# Patient Record
Sex: Female | Born: 1965 | Race: White | Hispanic: No | Marital: Married | Smoking: Never smoker
Health system: Southern US, Community
[De-identification: ages and names within clinical notes are randomized; demographics above are authoritative.]

## PROBLEM LIST (undated history)

## (undated) DIAGNOSIS — N200 Calculus of kidney: Secondary | ICD-10-CM

## (undated) DIAGNOSIS — E785 Hyperlipidemia, unspecified: Secondary | ICD-10-CM

## (undated) DIAGNOSIS — D649 Anemia, unspecified: Secondary | ICD-10-CM

## (undated) DIAGNOSIS — E669 Obesity, unspecified: Secondary | ICD-10-CM

## (undated) DIAGNOSIS — M1990 Unspecified osteoarthritis, unspecified site: Secondary | ICD-10-CM

## (undated) DIAGNOSIS — I10 Essential (primary) hypertension: Secondary | ICD-10-CM

## (undated) DIAGNOSIS — T4145XA Adverse effect of unspecified anesthetic, initial encounter: Secondary | ICD-10-CM

## (undated) DIAGNOSIS — Z460 Encounter for fitting and adjustment of spectacles and contact lenses: Secondary | ICD-10-CM

## (undated) DIAGNOSIS — K8020 Calculus of gallbladder without cholecystitis without obstruction: Secondary | ICD-10-CM

## (undated) DIAGNOSIS — N920 Excessive and frequent menstruation with regular cycle: Secondary | ICD-10-CM

## (undated) DIAGNOSIS — T8859XA Other complications of anesthesia, initial encounter: Secondary | ICD-10-CM

## (undated) HISTORY — DX: Hyperlipidemia, unspecified: E78.5

## (undated) HISTORY — DX: Unspecified osteoarthritis, unspecified site: M19.90

## (undated) HISTORY — PX: ENDOMETRIAL ABLATION: SHX621

## (undated) HISTORY — DX: Calculus of kidney: N20.0

## (undated) HISTORY — DX: Obesity, unspecified: E66.9

## (undated) HISTORY — DX: Essential (primary) hypertension: I10

## (undated) HISTORY — DX: Calculus of gallbladder without cholecystitis without obstruction: K80.20

## (undated) HISTORY — DX: Anemia, unspecified: D64.9

---

## 1987-07-20 HISTORY — PX: OTHER SURGICAL HISTORY: SHX169

## 2000-05-05 ENCOUNTER — Encounter (INDEPENDENT_AMBULATORY_CARE_PROVIDER_SITE_OTHER): Payer: Self-pay | Admitting: Specialist

## 2000-05-05 ENCOUNTER — Other Ambulatory Visit: Admission: RE | Admit: 2000-05-05 | Discharge: 2000-05-05 | Payer: Self-pay | Admitting: *Deleted

## 2000-07-19 HISTORY — PX: NASAL SINUS SURGERY: SHX719

## 2000-09-16 ENCOUNTER — Other Ambulatory Visit: Admission: RE | Admit: 2000-09-16 | Discharge: 2000-09-16 | Payer: Self-pay | Admitting: Obstetrics and Gynecology

## 2001-07-19 HISTORY — PX: OTHER SURGICAL HISTORY: SHX169

## 2001-11-13 ENCOUNTER — Other Ambulatory Visit: Admission: RE | Admit: 2001-11-13 | Discharge: 2001-11-13 | Payer: Self-pay | Admitting: Obstetrics and Gynecology

## 2002-11-15 ENCOUNTER — Other Ambulatory Visit: Admission: RE | Admit: 2002-11-15 | Discharge: 2002-11-15 | Payer: Self-pay | Admitting: Obstetrics and Gynecology

## 2003-12-12 ENCOUNTER — Other Ambulatory Visit: Admission: RE | Admit: 2003-12-12 | Discharge: 2003-12-12 | Payer: Self-pay | Admitting: Obstetrics and Gynecology

## 2004-12-30 ENCOUNTER — Other Ambulatory Visit: Admission: RE | Admit: 2004-12-30 | Discharge: 2004-12-30 | Payer: Self-pay | Admitting: Obstetrics and Gynecology

## 2005-11-16 ENCOUNTER — Encounter: Admission: RE | Admit: 2005-11-16 | Discharge: 2005-11-16 | Payer: Self-pay | Admitting: Family Medicine

## 2005-11-24 ENCOUNTER — Encounter: Admission: RE | Admit: 2005-11-24 | Discharge: 2005-11-24 | Payer: Self-pay | Admitting: Family Medicine

## 2005-12-03 ENCOUNTER — Encounter: Admission: RE | Admit: 2005-12-03 | Discharge: 2005-12-03 | Payer: Self-pay | Admitting: Obstetrics and Gynecology

## 2006-03-01 ENCOUNTER — Other Ambulatory Visit: Admission: RE | Admit: 2006-03-01 | Discharge: 2006-03-01 | Payer: Self-pay | Admitting: Obstetrics and Gynecology

## 2006-06-23 ENCOUNTER — Encounter: Admission: RE | Admit: 2006-06-23 | Discharge: 2006-06-23 | Payer: Self-pay | Admitting: Orthopedic Surgery

## 2008-03-18 ENCOUNTER — Emergency Department (HOSPITAL_COMMUNITY): Admission: EM | Admit: 2008-03-18 | Discharge: 2008-03-18 | Payer: Self-pay | Admitting: *Deleted

## 2009-07-19 HISTORY — PX: ABLATION ON ENDOMETRIOSIS: SHX5787

## 2009-07-31 ENCOUNTER — Ambulatory Visit (HOSPITAL_COMMUNITY): Admission: RE | Admit: 2009-07-31 | Discharge: 2009-07-31 | Payer: Self-pay | Admitting: Obstetrics and Gynecology

## 2010-10-04 LAB — CBC
HCT: 39.3 % (ref 36.0–46.0)
MCV: 90 fL (ref 78.0–100.0)
Platelets: 328 10*3/uL (ref 150–400)
RDW: 14.9 % (ref 11.5–15.5)

## 2010-10-04 LAB — GLUCOSE, CAPILLARY: Glucose-Capillary: 149 mg/dL — ABNORMAL HIGH (ref 70–99)

## 2010-10-04 LAB — BASIC METABOLIC PANEL
GFR calc Af Amer: >60 mL/min (ref >60)
Potassium: 3.5 mEq/L (ref 3.5–5.1)
Sodium: 138 mEq/L (ref 135–145)

## 2011-05-11 ENCOUNTER — Other Ambulatory Visit: Payer: Self-pay | Admitting: Family Medicine

## 2011-05-11 DIAGNOSIS — R1011 Right upper quadrant pain: Secondary | ICD-10-CM

## 2011-05-12 ENCOUNTER — Ambulatory Visit
Admission: RE | Admit: 2011-05-12 | Discharge: 2011-05-12 | Disposition: A | Discharge status: Home / Self Care | Payer: PRIVATE HEALTH INSURANCE | Source: Ambulatory Visit | Attending: Family Medicine | Admitting: Family Medicine

## 2011-05-12 ENCOUNTER — Emergency Department (HOSPITAL_COMMUNITY)
Admission: EM | Admit: 2011-05-12 | Discharge: 2011-05-12 | Disposition: A | Discharge status: Home / Self Care | Payer: PRIVATE HEALTH INSURANCE | Attending: Emergency Medicine | Admitting: Emergency Medicine

## 2011-05-12 ENCOUNTER — Telehealth (INDEPENDENT_AMBULATORY_CARE_PROVIDER_SITE_OTHER): Payer: Self-pay | Admitting: General Surgery

## 2011-05-12 DIAGNOSIS — R1011 Right upper quadrant pain: Secondary | ICD-10-CM

## 2011-05-12 NOTE — Telephone Encounter (Signed)
Received a call from Tal[place] at [facility] [phone] stating Christina Howard was at their office and they wanted to set her up with an appointment at our office. I looked up the information regarding the pt and found that she had an ultrasound of her abdomen, and the results show acute "Cholelithiasis without sonographic findings for acute cholecystitis. Equivocal sonographic Murphy's sign." Upon asking in triage, French Ana advised the patient needs to go directly to the ER for evaluation, since if she was seen here, should would end up being sent there. This information was relayed to [place] at [place].

## 2011-05-14 ENCOUNTER — Ambulatory Visit (INDEPENDENT_AMBULATORY_CARE_PROVIDER_SITE_OTHER): Payer: PRIVATE HEALTH INSURANCE | Admitting: General Surgery

## 2011-05-14 ENCOUNTER — Encounter (INDEPENDENT_AMBULATORY_CARE_PROVIDER_SITE_OTHER): Payer: Self-pay | Admitting: General Surgery

## 2011-05-14 VITALS — BP 134/92 | HR 76 | Temp 97.8°F | Ht 64.0 in | Wt 193.0 lb

## 2011-05-14 DIAGNOSIS — K8020 Calculus of gallbladder without cholecystitis without obstruction: Secondary | ICD-10-CM

## 2011-05-14 NOTE — Patient Instructions (Addendum)
Our office schedulers will call you early next week to schedule your surgery. If you do not hear from my office by Tuesday afternoon, please call us [phone]   Laparoscopic Cholecystectomy Laparoscopic cholecystectomy is surgery to remove the gallbladder. The gallbladder is located slightly to the right of center in the abdomen, behind the liver. It is a concentrating and storage sac for the bile produced in the liver. Bile aids in the digestion and absorption of fats. Gallbladder disease (cholecystitis) is an inflammation of your gallbladder. This condition is usually caused by a buildup of gallstones (cholelithiasis) in your gallbladder. Gallstones can block the flow of bile, resulting in inflammation and pain. In severe cases, emergency surgery may be required. When emergency surgery is not required, you will have time to prepare for the procedure. Laparoscopic surgery is an alternative to open surgery. Laparoscopic surgery usually has a shorter recovery time. Your common bile duct may also need to be examined and explored. Your caregiver will discuss this with you if he or she feels this should be done. If stones are found in the common bile duct, they may be removed. LET YOUR CAREGIVER KNOW ABOUT:  Allergies to food or medicine.   Medicines taken, including vitamins, herbs, eyedrops, over-the-counter medicines, and creams.   Use of steroids (by mouth or creams).   Previous problems with anesthetics or numbing medicines.   History of bleeding problems or blood clots.   Previous surgery.   Other health problems, including diabetes and kidney problems.   Possibility of pregnancy, if this applies.  RISKS AND COMPLICATIONS All surgery is associated with risks. Some problems that may occur following this procedure include:  Infection.   Damage to the common bile duct, nerves, arteries, veins, or other internal organs such as the stomach or intestines.   Bleeding.   A stone may remain  in the common bile duct.  BEFORE THE PROCEDURE  Do not take aspirin for 3 days prior to surgery or blood thinners for 1 week prior to surgery.   Do not eat or drink anything after midnight the night before surgery.   Let your caregiver know if you develop a cold or other infectious problem prior to surgery.   You should be present 60 minutes before the procedure or as directed.  PROCEDURE  You will be given medicine that makes you sleep (general anesthetic). When you are asleep, your surgeon will make several small cuts (incisions) in your abdomen. One of these incisions is used to insert a small, lighted scope (laparoscope) into the abdomen. The laparoscope helps the surgeon see into your abdomen. Carbon dioxide gas will be pumped into your abdomen. The gas allows more room for the surgeon to perform your surgery. Other operating instruments are inserted through the other incisions. Laparoscopic procedures may not be appropriate when:  There is major scarring from previous surgery.   The gallbladder is extremely inflamed.   There are bleeding disorders or unexpected cirrhosis of the liver.   A pregnancy is near term.   Other conditions make the laparoscopic procedure impossible.  If your surgeon feels it is not safe to continue with a laparoscopic procedure, he or she will perform an open abdominal procedure. In this case, the surgeon will make an incision to open the abdomen. This gives the surgeon a larger view and field to work within. This may allow the surgeon to perform procedures that sometimes cannot be performed with a laparoscope alone. Open surgery has a longer recovery time.  AFTER THE PROCEDURE  You will be taken to the recovery area where a nurse will watch and check your progress.   You may be allowed to go home the same day.   Do not resume physical activities until directed by your caregiver.   You may resume a normal diet and activities as directed.  Document  Released: 07/05/2005 Document Revised: 03/17/2011 Document Reviewed: 12/18/2010 [facility] Patient Information 2012 [place], [place].

## 2011-05-17 ENCOUNTER — Telehealth (INDEPENDENT_AMBULATORY_CARE_PROVIDER_SITE_OTHER): Payer: Self-pay | Admitting: General Surgery

## 2011-05-17 NOTE — Telephone Encounter (Signed)
Requesting last office notes.

## 2011-05-22 ENCOUNTER — Encounter (INDEPENDENT_AMBULATORY_CARE_PROVIDER_SITE_OTHER): Payer: Self-pay | Admitting: General Surgery

## 2011-05-22 NOTE — Progress Notes (Signed)
Chief Complaint  Patient presents with  . Pre-op Exam    eval gallbladder    HPI Christina Howard is a 45 y.o. female.   HPI 45-year-old obese Caucasian female referred by her primary care physician for evaluation for gallbladder disease. The patient states that she was in her usual state of health until earlier this week. She developed severe pain in her upper abdomen Monday afternoon. She describes it as a bandlike sensation. It was very intense. She saw her primary care physician on Tuesday who subsequently ordered an ultrasound which demonstrated gallstones. She was evaluated in triage at the emergency department. It was felt that she did not have acute cholecystitis and was scheduled to followup with our office as an outpatient.  She states that she's had similar pain in the past but it has not been nearly as intense. The pain generally occurs after eating a meal. She describes it as a dull crushing pain that lasts for about 3-4 hours. The only alleviating factor has been some oral pain medication. She denies any jaundice. She states that she normally has alternating bouts of diarrhea and constipation. She denies any weight loss. She denies any fevers or chills. She denies any acholic stools. She denies any excessive NSAID use. She denies any dysphagia. She denies any heartburn. Past Medical History  Diagnosis Date  . Diabetes mellitus   . Hyperlipidemia   . Hypertension   . Obesity (BMI 30-39.9)     Past Surgical History  Procedure Date  . Knee scopes 2003    total  . Nasal sinus surgery 2002  . Barthalmus glands removed 1989    Family History  Problem Relation Age of Onset  . Hypertension Mother   . Hyperlipidemia Mother   . Diabetes Father   . Hyperlipidemia Father   . Hypertension Father     Social History History  Substance Use Topics  . Smoking status: Never Smoker   . Smokeless tobacco: Not on file  . Alcohol Use: Yes     very rarely    Allergies  Allergen  Reactions  . Penicillins     Yeast in mouth  . Sulfa Drugs Cross Reactors     Headache     Current Outpatient Prescriptions  Medication Sig Dispense Refill  . atorvastatin (LIPITOR) 10 MG tablet Take 10 mg by mouth daily.        . diphenhydrAMINE (BENADRYL) 25 mg capsule Take 25 mg by mouth as needed.        . hydrochlorothiazide (HYDRODIURIL) 25 MG tablet Take 25 mg by mouth daily.        . HYDROcodone-acetaminophen (VICODIN) 5-500 MG per tablet Take 1 tablet by mouth as needed.        . metFORMIN (GLUCOPHAGE) 500 MG tablet Take 500 mg by mouth 2 (two) times daily with a meal.        . metoprolol (TOPROL-XL) 50 MG 24 hr tablet Take 50 mg by mouth daily.        . norethindrone (AYGESTIN) 5 MG tablet Take 5 mg by mouth daily. 1-15 days of each month         Review of Systems Review of Systems  Constitutional: Negative for fever, chills and unexpected weight change.  HENT: Negative for hearing loss, congestion, sore throat, trouble swallowing and voice change.   Eyes: Negative for visual disturbance.  Respiratory: Negative for cough, shortness of breath and wheezing.   Cardiovascular: Negative for chest pain, palpitations and leg   swelling.       No DOE, no orthopnea, no SOB, no PND  Gastrointestinal: Positive for nausea, abdominal pain, diarrhea and constipation. Negative for vomiting, blood in stool, abdominal distention and anal bleeding.  Genitourinary: Negative for hematuria, vaginal bleeding and difficulty urinating.  Musculoskeletal: Negative for arthralgias.  Skin: Negative for rash and wound.  Neurological: Negative for seizures, syncope and headaches.  Hematological: Negative for adenopathy. Does not bruise/bleed easily.  Psychiatric/Behavioral: Negative for confusion.    Blood pressure 134/92, pulse 76, temperature 97.8 F (36.6 C), height 5' 4" (1.626 Howard), weight 193 lb (87.544 kg).  Physical Exam Physical Exam  Vitals reviewed. Constitutional: She is oriented to  person, place, and time. She appears well-developed and well-nourished. No distress.  HENT:  Head: Normocephalic and atraumatic.  Eyes: Conjunctivae are normal. No scleral icterus.  Neck: Normal range of motion. Neck supple. No tracheal deviation present. No thyromegaly present.  Cardiovascular: Normal rate, regular rhythm and normal heart sounds.   Pulmonary/Chest: Effort normal and breath sounds normal. No respiratory distress.  Abdominal: Soft. Bowel sounds are normal. She exhibits no distension. There is no tenderness. There is no guarding.  Musculoskeletal: Normal range of motion. She exhibits no edema.  Neurological: She is alert and oriented to person, place, and time.  Skin: Skin is warm and dry. No rash noted.       No jaundice  Psychiatric: She has a normal mood and affect. Her behavior is normal. Judgment and thought content normal.    Data Reviewed COMPLETE ABDOMINAL ULTRASOUND 04/2011 Comparison: CT scan 03/18/2008.  Findings:  Gallbladder: Numerous echogenic gallstones are present with  associated acoustic shadowing. The largest measures approximately  16 mm. No gallbladder wall thickening or pericholecystic fluid.  Equivocal sonographic Murphy's sign.   Common bile duct: Normal in caliber measuring a maximum of 3.9mm.   Liver: Diffuse increased echogenicity suggesting fatty  infiltration. Probable areas of focal fatty sparing involving the  caudate lobe, gallbladder fossa and right hepatic lobe. Looking  back at the prior CT examinations these findings are stable. The  smaller nodular lesion in the right lobe is not identified for  certain on the prior CT scan and I would recommend a follow-up  abdominal ultrasound examination in 6 months to confer stability.  No biliary dilatation.   IVC: Normal caliber.  Pancreas: Sonographically normal.  Spleen: Normal size and echogenicity without focal lesions.  Right Kidney: 12.0 cm in length. Normal renal cortical thickness   and echogenicity without focal lesions or hydronephrosis.  Left Kidney: 13.4 cm in length. Normal renal cortical thickness  and echogenicity without focal lesions or hydronephrosis.  Abdominal aorta: Normal caliber.   IMPRESSION:  1. Cholelithiasis without sonographic findings for acute  cholecystitis. Equivocal sonographic Murphy's sign.  2. Hepatic abnormalities as discussed above are likely due to  focal fatty sparing in a background of fatty infiltration.  Recommend follow-up ultrasound examination in 6 months.  3. Normal sonographic appearance of the pancreas, spleen and both  kidneys.   Assessment    Symptomatic cholelithiasis Diabetes Mellitus Dyslipidemia    Plan    We discussed gallbladder disease. The patient was given educational material. We discussed non-operative and operative management.   I discussed laparoscopic cholecystectomy with cholangiogram in detail.  The patient was given educational material as well as diagrams detailing the procedure.  We discussed the risks and benefits of a laparoscopic cholecystectomy including, but not limited to bleeding, infection, injury to surrounding structures such as the intestine   or liver, bile leak, retained gallstones, need to convert to an open procedure, prolonged diarrhea, blood clots such as  DVT, common bile duct injury, anesthesia risks, and possible need for additional procedures.  We discussed the typical post-operative recovery course. I explained that the likelihood of improvement of their symptoms is good.  The patient has elected to proceed with laparoscopic cholecystectomy with cholangiogram.  Christina Hanaway Howard. Habiba Treloar, MD, FACS        Christina Howard 05/22/2011, 2:20 PM    

## 2011-05-24 ENCOUNTER — Encounter (HOSPITAL_COMMUNITY): Payer: Self-pay | Admitting: Pharmacy Technician

## 2011-05-25 ENCOUNTER — Ambulatory Visit (HOSPITAL_COMMUNITY)
Admission: RE | Admit: 2011-05-25 | Discharge: 2011-05-25 | Disposition: A | Discharge status: Home / Self Care | Payer: PRIVATE HEALTH INSURANCE | Source: Ambulatory Visit | Attending: General Surgery | Admitting: General Surgery

## 2011-05-25 ENCOUNTER — Encounter (HOSPITAL_COMMUNITY): Payer: PRIVATE HEALTH INSURANCE

## 2011-05-25 ENCOUNTER — Encounter (HOSPITAL_COMMUNITY): Payer: Self-pay

## 2011-05-25 ENCOUNTER — Other Ambulatory Visit: Payer: Self-pay

## 2011-05-25 DIAGNOSIS — Z01812 Encounter for preprocedural laboratory examination: Secondary | ICD-10-CM | POA: Insufficient documentation

## 2011-05-25 DIAGNOSIS — Z01818 Encounter for other preprocedural examination: Secondary | ICD-10-CM | POA: Insufficient documentation

## 2011-05-25 LAB — DIFFERENTIAL
Basophils Absolute: 0 10*3/uL (ref 0.0–0.1)
Eosinophils Absolute: 0.3 10*3/uL (ref 0.0–0.7)
Eosinophils Relative: 4 % (ref 0–5)
Lymphs Abs: 1.7 10*3/uL (ref 0.7–4.0)
Monocytes Absolute: 0.7 10*3/uL (ref 0.1–1.0)

## 2011-05-25 LAB — SURGICAL PCR SCREEN
MRSA, PCR: NEGATIVE
Staphylococcus aureus: NEGATIVE

## 2011-05-25 LAB — COMPREHENSIVE METABOLIC PANEL
AST: 14 U/L (ref 0–37)
BUN: 14 mg/dL (ref 6–23)
CO2: 22 mEq/L (ref 19–32)
Calcium: 9.9 mg/dL (ref 8.4–10.5)
Creatinine, Ser: 0.59 mg/dL (ref 0.50–1.10)
GFR calc Af Amer: >90 mL/min (ref >90)
Glucose, Bld: 129 mg/dL — ABNORMAL HIGH (ref 70–99)

## 2011-05-25 LAB — CBC
HCT: 34.1 % — ABNORMAL LOW (ref 36.0–46.0)
MCH: 27.8 pg (ref 26.0–34.0)
MCHC: 32.6 g/dL (ref 30.0–36.0)
MCV: 85.3 fL (ref 78.0–100.0)
Platelets: 345 10*3/uL (ref 150–400)
WBC: 7.8 10*3/uL (ref 4.0–10.5)

## 2011-05-25 NOTE — Patient Instructions (Addendum)
20 Christina Howard  05/25/2011   Your procedure is scheduled on:  11/12/2  Report to [facility] at 8:15 AM.  Call this number if you have problems the morning of surgery: [phone]   Remember:   Do not eat food:After Midnight.  Do not drink clear liquids: After Midnight.  Take these medicines the morning of surgery with A SIP OF WATER: VICODIN IF NEEDED   Do not wear jewelry, make-up or nail polish.  Do not wear lotions, powders, or perfumes. You may wear deodorant.  Do not shave 48 hours prior to surgery.  Do not bring valuables to the hospital.  Contacts, dentures or bridgework may not be worn into surgery.  Leave suitcase in the car. After surgery it may be brought to your room.  For patients admitted to the hospital, checkout time is 11:00 AM the day of discharge.   Patients discharged the day of surgery will not be allowed to drive home.  Name and phone number of your driver:   Special Instructions: Incentive Spirometry - Practice and bring it with you on the day of surgery. and CHG Shower Use Special Wash: 1/2 bottle night before surgery and 1/2 bottle morning of surgery.   Please read over the following fact sheets that you were given: MRSA Information

## 2011-05-26 ENCOUNTER — Telehealth (INDEPENDENT_AMBULATORY_CARE_PROVIDER_SITE_OTHER): Payer: Self-pay | Admitting: General Surgery

## 2011-05-26 NOTE — Telephone Encounter (Signed)
Message copied by Liliana Cline on Wed May 26, 2011  8:46 AM ------      Message from: Andrey Campanile, ERIC M      Created: Tue May 25, 2011  5:07 PM       Labs ok for surgery

## 2011-05-26 NOTE — Telephone Encounter (Signed)
Labs okay for surgery faxed to pre-op.  

## 2011-05-31 ENCOUNTER — Ambulatory Visit (HOSPITAL_COMMUNITY)
Admission: RE | Admit: 2011-05-31 | Discharge: 2011-05-31 | Disposition: A | Discharge status: Home / Self Care | Payer: PRIVATE HEALTH INSURANCE | Source: Ambulatory Visit | Attending: General Surgery | Admitting: General Surgery

## 2011-05-31 ENCOUNTER — Other Ambulatory Visit (INDEPENDENT_AMBULATORY_CARE_PROVIDER_SITE_OTHER): Payer: Self-pay | Admitting: General Surgery

## 2011-05-31 ENCOUNTER — Encounter (HOSPITAL_COMMUNITY): Payer: Self-pay | Admitting: Anesthesiology

## 2011-05-31 ENCOUNTER — Encounter (HOSPITAL_COMMUNITY): Payer: Self-pay | Admitting: *Deleted

## 2011-05-31 ENCOUNTER — Ambulatory Visit (HOSPITAL_COMMUNITY): Payer: PRIVATE HEALTH INSURANCE

## 2011-05-31 ENCOUNTER — Encounter (HOSPITAL_COMMUNITY)
Admission: RE | Disposition: A | Discharge status: Home / Self Care | Payer: Self-pay | Source: Ambulatory Visit | Attending: General Surgery

## 2011-05-31 DIAGNOSIS — Z79899 Other long term (current) drug therapy: Secondary | ICD-10-CM | POA: Insufficient documentation

## 2011-05-31 DIAGNOSIS — E669 Obesity, unspecified: Secondary | ICD-10-CM | POA: Insufficient documentation

## 2011-05-31 DIAGNOSIS — Z01810 Encounter for preprocedural cardiovascular examination: Secondary | ICD-10-CM | POA: Insufficient documentation

## 2011-05-31 DIAGNOSIS — R1013 Epigastric pain: Secondary | ICD-10-CM | POA: Insufficient documentation

## 2011-05-31 DIAGNOSIS — K811 Chronic cholecystitis: Secondary | ICD-10-CM

## 2011-05-31 DIAGNOSIS — E785 Hyperlipidemia, unspecified: Secondary | ICD-10-CM | POA: Insufficient documentation

## 2011-05-31 DIAGNOSIS — I10 Essential (primary) hypertension: Secondary | ICD-10-CM | POA: Insufficient documentation

## 2011-05-31 DIAGNOSIS — R1011 Right upper quadrant pain: Secondary | ICD-10-CM | POA: Insufficient documentation

## 2011-05-31 DIAGNOSIS — E119 Type 2 diabetes mellitus without complications: Secondary | ICD-10-CM | POA: Insufficient documentation

## 2011-05-31 DIAGNOSIS — K8010 Calculus of gallbladder with chronic cholecystitis without obstruction: Secondary | ICD-10-CM | POA: Insufficient documentation

## 2011-05-31 HISTORY — PX: CHOLECYSTECTOMY: SHX55

## 2011-05-31 LAB — GLUCOSE, CAPILLARY: Glucose-Capillary: 183 mg/dL — ABNORMAL HIGH (ref 70–99)

## 2011-05-31 SURGERY — LAPAROSCOPIC CHOLECYSTECTOMY WITH INTRAOPERATIVE CHOLANGIOGRAM
Anesthesia: General | Wound class: Clean Contaminated

## 2011-05-31 MED ORDER — CIPROFLOXACIN IN D5W 400 MG/200ML IV SOLN
INTRAVENOUS | Status: AC
  Filled 2011-05-31: qty 200

## 2011-05-31 MED ORDER — CIPROFLOXACIN IN D5W 400 MG/200ML IV SOLN
400.0000 mg | Freq: Once | INTRAVENOUS | Status: AC
  Administered 2011-05-31: 400 mg via INTRAVENOUS
  Filled 2011-05-31: qty 200

## 2011-05-31 MED ORDER — SODIUM CHLORIDE 0.9 % IR SOLN
Status: DC | PRN
  Administered 2011-05-31: 1000 mL

## 2011-05-31 MED ORDER — OXYCODONE-ACETAMINOPHEN 5-325 MG PO TABS
ORAL_TABLET | ORAL | Status: AC
  Filled 2011-05-31: qty 1

## 2011-05-31 MED ORDER — PROPOFOL 10 MG/ML IV EMUL
INTRAVENOUS | Status: DC | PRN
  Administered 2011-05-31: 200 mg via INTRAVENOUS

## 2011-05-31 MED ORDER — LACTATED RINGERS IV SOLN
INTRAVENOUS | Status: DC
  Administered 2011-05-31: 11:00:00 via INTRAVENOUS
  Administered 2011-05-31: 1000 mL via INTRAVENOUS

## 2011-05-31 MED ORDER — NEOSTIGMINE METHYLSULFATE 1 MG/ML IJ SOLN
INTRAMUSCULAR | Status: DC | PRN
  Administered 2011-05-31: 5 mg via INTRAVENOUS

## 2011-05-31 MED ORDER — IOHEXOL 300 MG/ML  SOLN
INTRAMUSCULAR | Status: DC | PRN
  Administered 2011-05-31: 15 mL

## 2011-05-31 MED ORDER — LACTATED RINGERS IR SOLN
Status: DC | PRN
  Administered 2011-05-31: 1000 mL

## 2011-05-31 MED ORDER — ONDANSETRON HCL 4 MG/2ML IJ SOLN
INTRAMUSCULAR | Status: DC | PRN
  Administered 2011-05-31: 4 mg via INTRAVENOUS

## 2011-05-31 MED ORDER — BUPIVACAINE-EPINEPHRINE 0.25% -1:200000 IJ SOLN
INTRAMUSCULAR | Status: DC | PRN
  Administered 2011-05-31: 24 mL

## 2011-05-31 MED ORDER — ACETAMINOPHEN 10 MG/ML IV SOLN
INTRAVENOUS | Status: DC | PRN
  Administered 2011-05-31: 1000 mg via INTRAVENOUS

## 2011-05-31 MED ORDER — MIDAZOLAM HCL 5 MG/5ML IJ SOLN
INTRAMUSCULAR | Status: DC | PRN
  Administered 2011-05-31: 2 mg via INTRAVENOUS

## 2011-05-31 MED ORDER — LIDOCAINE HCL (CARDIAC) 20 MG/ML IV SOLN
INTRAVENOUS | Status: DC | PRN
  Administered 2011-05-31: 80 mg via INTRAVENOUS

## 2011-05-31 MED ORDER — GLYCOPYRROLATE 0.2 MG/ML IJ SOLN
INTRAMUSCULAR | Status: DC | PRN
  Administered 2011-05-31: .8 mg via INTRAVENOUS

## 2011-05-31 MED ORDER — IOHEXOL 300 MG/ML  SOLN
INTRAMUSCULAR | Status: AC
  Filled 2011-05-31: qty 1

## 2011-05-31 MED ORDER — OXYCODONE-ACETAMINOPHEN 5-325 MG PO TABS: 1.0000 | ORAL_TABLET | ORAL | Status: AC | PRN

## 2011-05-31 MED ORDER — HEMOSTATIC AGENTS (NO CHARGE) OPTIME
TOPICAL | Status: DC | PRN
  Administered 2011-05-31: 1

## 2011-05-31 MED ORDER — OXYCODONE-ACETAMINOPHEN 5-325 MG PO TABS
1.0000 | ORAL_TABLET | ORAL | Status: DC | PRN
  Administered 2011-05-31: 1 via ORAL

## 2011-05-31 MED ORDER — BUPIVACAINE-EPINEPHRINE 0.25% -1:200000 IJ SOLN
INTRAMUSCULAR | Status: AC
  Filled 2011-05-31: qty 1

## 2011-05-31 MED ORDER — FENTANYL CITRATE 0.05 MG/ML IJ SOLN: 25.0000 ug | INTRAMUSCULAR | Status: DC | PRN

## 2011-05-31 MED ORDER — PROMETHAZINE HCL 25 MG/ML IJ SOLN: 6.2500 mg | INTRAMUSCULAR | Status: DC | PRN

## 2011-05-31 MED ORDER — METOCLOPRAMIDE HCL 5 MG/ML IJ SOLN
INTRAMUSCULAR | Status: DC | PRN
  Administered 2011-05-31: 10 mg via INTRAVENOUS

## 2011-05-31 MED ORDER — ROCURONIUM BROMIDE 100 MG/10ML IV SOLN
INTRAVENOUS | Status: DC | PRN
Start: 2011-05-31 — End: 2011-05-31
  Administered 2011-05-31: 35 mg via INTRAVENOUS
  Administered 2011-05-31: 5 mg via INTRAVENOUS

## 2011-05-31 MED ORDER — ACETAMINOPHEN 10 MG/ML IV SOLN
INTRAVENOUS | Status: AC
  Filled 2011-05-31: qty 100

## 2011-05-31 MED ORDER — FENTANYL CITRATE 0.05 MG/ML IJ SOLN
INTRAMUSCULAR | Status: DC | PRN
  Administered 2011-05-31 (×3): 50 ug via INTRAVENOUS

## 2011-05-31 SURGICAL SUPPLY — 42 items
APL SKNCLS STERI-STRIP NONHPOA (GAUZE/BANDAGES/DRESSINGS) ×1
APPLIER CLIP 5 13 M/L LIGAMAX5 (MISCELLANEOUS) ×2
APPLIER CLIP ROT 10 11.4 M/L (STAPLE)
APR CLP MED LRG 11.4X10 (STAPLE)
APR CLP MED LRG 5 ANG JAW (MISCELLANEOUS) ×1
BAG SPEC RTRVL LRG 6X4 10 (ENDOMECHANICALS) ×1
BANDAGE ADHESIVE 1X3 (GAUZE/BANDAGES/DRESSINGS) ×6 IMPLANT
BENZOIN TINCTURE PRP APPL 2/3 (GAUZE/BANDAGES/DRESSINGS) ×2 IMPLANT
CANISTER SUCTION 2500CC (MISCELLANEOUS) ×2 IMPLANT
CHLORAPREP W/TINT 26ML (MISCELLANEOUS) ×2 IMPLANT
CLIP APPLIE 5 13 M/L LIGAMAX5 (MISCELLANEOUS) IMPLANT
CLIP APPLIE ROT 10 11.4 M/L (STAPLE) IMPLANT
CLOTH BEACON ORANGE TIMEOUT ST (SAFETY) ×2 IMPLANT
COVER MAYO STAND STRL (DRAPES) IMPLANT
COVER SURGICAL LIGHT HANDLE (MISCELLANEOUS) ×2 IMPLANT
DECANTER SPIKE VIAL GLASS SM (MISCELLANEOUS) ×2 IMPLANT
DRAPE C-ARM 42X72 X-RAY (DRAPES) IMPLANT
DRAPE LAPAROSCOPIC ABDOMINAL (DRAPES) ×2 IMPLANT
DRAPE UTILITY XL STRL (DRAPES) ×2 IMPLANT
DRSG TEGADERM 2-3/8X2-3/4 SM (GAUZE/BANDAGES/DRESSINGS) ×2 IMPLANT
ELECT REM PT RETURN 9FT ADLT (ELECTROSURGICAL) ×2
ELECTRODE REM PT RTRN 9FT ADLT (ELECTROSURGICAL) ×1 IMPLANT
GLOVE BIO SURGEON STRL SZ7.5 (GLOVE) ×2 IMPLANT
GLOVE BIOGEL PI IND STRL 7.0 (GLOVE) ×1 IMPLANT
GLOVE BIOGEL PI INDICATOR 7.0 (GLOVE) ×1
GLOVE INDICATOR 8.0 STRL GRN (GLOVE) ×2 IMPLANT
GOWN STRL NON-REIN LRG LVL3 (GOWN DISPOSABLE) ×2 IMPLANT
GOWN STRL REIN XL XLG (GOWN DISPOSABLE) ×4 IMPLANT
HEMOSTAT SNOW SURGICEL 2X4 (HEMOSTASIS) ×1 IMPLANT
KIT BASIN OR (CUSTOM PROCEDURE TRAY) ×2 IMPLANT
NS IRRIG 1000ML POUR BTL (IV SOLUTION) ×2 IMPLANT
POUCH SPECIMEN RETRIEVAL 10MM (ENDOMECHANICALS) ×2 IMPLANT
SET CHOLANGIOGRAPH MIX (MISCELLANEOUS) IMPLANT
SET IRRIG TUBING LAPAROSCOPIC (IRRIGATION / IRRIGATOR) ×2 IMPLANT
SOLUTION ANTI FOG 6CC (MISCELLANEOUS) ×2 IMPLANT
STRIP CLOSURE SKIN 1/2X4 (GAUZE/BANDAGES/DRESSINGS) ×2 IMPLANT
SUT MNCRL AB 4-0 PS2 18 (SUTURE) ×2 IMPLANT
TOWEL OR 17X26 10 PK STRL BLUE (TOWEL DISPOSABLE) ×2 IMPLANT
TRAY LAP CHOLE (CUSTOM PROCEDURE TRAY) ×2 IMPLANT
TROCAR BLADELESS OPT 5 75 (ENDOMECHANICALS) ×6 IMPLANT
TROCAR XCEL BLUNT TIP 100MML (ENDOMECHANICALS) ×2 IMPLANT
TUBING INSUFFLATION 10FT LAP (TUBING) ×2 IMPLANT

## 2011-05-31 NOTE — Preoperative (Signed)
Beta Blockers   Reason not to administer Beta Blockers:Pt took Metoprolol 05-30-11 at 2100

## 2011-05-31 NOTE — Interval H&P Note (Signed)
History and Physical Interval Note:   05/31/2011   9:39 AM   Christina Howard  has presented today for surgery, with the diagnosis of symptomatic cholelithiasis  The various methods of treatment have been discussed with the patient and family. After consideration of risks, benefits and other options for treatment, the patient has consented to  Procedure(s): LAPAROSCOPIC CHOLECYSTECTOMY WITH INTRAOPERATIVE CHOLANGIOGRAM as a surgical intervention .  The patients' history has been reviewed, patient examined, no change in status, stable for surgery.  I have reviewed the patients' chart and labs.  Questions were answered to the patient's satisfaction.    Mary Sella. Andrey Campanile, MD, FACS General, Bariatric, & Minimally Invasive Surgery [facility], [place]  [name]  MD

## 2011-05-31 NOTE — Transfer of Care (Signed)
Immediate Anesthesia Transfer of Care Note  Patient: Christina Howard  Procedure(s) Performed:  LAPAROSCOPIC CHOLECYSTECTOMY WITH INTRAOPERATIVE CHOLANGIOGRAM  Patient Location: PACU  Anesthesia Type: General  Level of Consciousness: awake, sedated, patient cooperative and responds to stimulaton  Airway & Oxygen Therapy: Patient Spontanous Breathing and Patient connected to face mask oxgen  Post-op Assessment: Report given to PACU RN  Post vital signs: Reviewed and stable  Complications: No apparent anesthesia complications

## 2011-05-31 NOTE — Op Note (Signed)
Laparoscopic Cholecystectomy with IOC Procedure Note  Indications: This patient presents with symptomatic gallbladder disease and will undergo laparoscopic cholecystectomy.  Pre-operative Diagnosis: Symptomatic cholelithiasis  Post-operative Diagnosis: Chronic cholecystitis  Surgeon: [name]   Assistants: Dr Consuello Bossier  Anesthesia: General endotracheal anesthesia plus 0.25% marcaine with epi  Indications for procedure: 45 yo WF with several episodes of RUQ & epigastric pain lasting several hours. U/s showed gallstones.   Procedure Details  The patient was seen again in the Holding Room. The risks, benefits, complications, treatment options, and expected outcomes were discussed with the patient. The possibilities of reaction to medication, pulmonary aspiration, perforation of viscus, bleeding, recurrent infection, finding a normal gallbladder, the need for additional procedures, failure to diagnose a condition, the possible need to convert to an open procedure, and creating a complication requiring transfusion or operation were discussed with the patient. The likelihood of improving the patient's symptoms with return to their baseline status is good.  The patient and/or family concurred with the proposed plan, giving informed consent. The site of surgery properly noted. The patient was taken to Operating Room, identified as Christina Howard and the procedure verified as Laparoscopic Cholecystectomy with Intraoperative Cholangiogram. A Time Out was held and the above information confirmed.  Prior to the induction of general anesthesia, antibiotic prophylaxis was administered. General endotracheal anesthesia was then administered and tolerated well. After the induction, the abdomen was prepped with Chloraprep and draped in the sterile fashion. The patient was positioned in the supine position. SCDs were in place.   Local anesthetic agent was injected into the skin near the umbilicus and  an incision made. We dissected down to the abdominal fascia with blunt dissection.  The fascia was incised vertically and we entered the peritoneal cavity bluntly.  A pursestring suture of 0-Vicryl was placed around the fascial opening.  The Hasson cannula was inserted and secured with the stay suture.  Pneumoperitoneum was then created with CO2 and tolerated well without any adverse changes in the patient's vital signs. An 5mm port was placed in the subxiphoid position.  Two 5-mm ports were placed in the right upper quadrant. All skin incisions were infiltrated with a local anesthetic agent before making the incision and placing the trocars.   We positioned the patient in reverse Trendelenburg, tilted slightly to the patient's left.  The gallbladder was identified, the fundus grasped and retracted cephalad. Adhesions were lysed bluntly and with the electrocautery where indicated, taking care not to injure any adjacent organs or viscus. The infundibulum was grasped and retracted laterally, exposing the peritoneum overlying the triangle of Calot. This was then divided and exposed in a blunt fashion. A critical view of the cystic duct and cystic artery was obtained.  The cystic duct was clearly identified and bluntly dissected circumferentially. The cystic duct was ligated with a clip distally.   An incision was made in the cystic duct and the [facility] cholangiogram catheter introduced. The catheter was secured using a clip. A cholangiogram was then obtained which showed good visualization of the distal and proximal biliary tree with no sign of filling defects or obstruction.  Contrast flowed easily into the duodenum. The catheter was then removed.   The cystic duct was then ligated with 3 clips and divided. The cystic artery was identified, dissected free, ligated with 2 clips and divided as well.   The gallbladder was dissected from the liver bed in retrograde fashion with the electrocautery. The gallbladder was  removed and placed in an  Endocatch sac. The liver bed was irrigated and inspected. Hemostasis was achieved with the electrocautery. Copious irrigation was utilized and was repeatedly aspirated until clear.  Surgical SNoW was placed in the gallbladder fossa. The gallbladder and Endocatch sac were then removed through the umbilical port site.  The pursestring suture was used to close the umbilical fascia.    We again inspected the right upper quadrant for hemostasis. The infra-umbilical closure was inspected. There was no air leak & the fascia was well approximated.  Pneumoperitoneum was released as we removed the trocars.  4-0 Monocryl was used to close the skin.   Dermabond was applied. The patient was then extubated and brought to the recovery room in stable condition. Instrument, sponge, and needle counts were correct at closure and at the conclusion of the case.   Findings: Chronic Cholecystitis with Cholelithiasis  Estimated Blood Loss: Minimal                Specimens: Gallbladder           Complications: None; patient tolerated the procedure well.         Disposition: PACU - hemodynamically stable.         Condition: stable

## 2011-05-31 NOTE — H&P (View-Only) (Signed)
Chief Complaint  Patient presents with  . Pre-op Exam    eval gallbladder    HPI Christina Howard is a 45 y.o. female.   HPI 45 year old obese Caucasian female referred by her primary care physician for evaluation for gallbladder disease. The patient states that she was in her usual state of health until earlier this week. She developed severe pain in her upper abdomen Monday afternoon. She describes it as a bandlike sensation. It was very intense. She saw her primary care physician on Tuesday who subsequently ordered an ultrasound which demonstrated gallstones. She was evaluated in triage at the emergency department. It was felt that she did not have acute cholecystitis and was scheduled to followup with our office as an outpatient.  She states that she's had similar pain in the past but it has not been nearly as intense. The pain generally occurs after eating a meal. She describes it as a dull crushing pain that lasts for about 3-4 hours. The only alleviating factor has been some oral pain medication. She denies any jaundice. She states that she normally has alternating bouts of diarrhea and constipation. She denies any weight loss. She denies any fevers or chills. She denies any acholic stools. She denies any excessive NSAID use. She denies any dysphagia. She denies any heartburn. Past Medical History  Diagnosis Date  . Diabetes mellitus   . Hyperlipidemia   . Hypertension   . Obesity (BMI 30-39.9)     Past Surgical History  Procedure Date  . Knee scopes 2003    total  . Nasal sinus surgery 2002  . Barthalmus glands removed 1989    Family History  Problem Relation Age of Onset  . Hypertension Mother   . Hyperlipidemia Mother   . Diabetes Father   . Hyperlipidemia Father   . Hypertension Father     Social History History  Substance Use Topics  . Smoking status: Never Smoker   . Smokeless tobacco: Not on file  . Alcohol Use: Yes     very rarely    Allergies  Allergen  Reactions  . Penicillins     Yeast in mouth  . Sulfa Drugs Cross Reactors     Headache     Current Outpatient Prescriptions  Medication Sig Dispense Refill  . atorvastatin (LIPITOR) 10 MG tablet Take 10 mg by mouth daily.        . diphenhydrAMINE (BENADRYL) 25 mg capsule Take 25 mg by mouth as needed.        . hydrochlorothiazide (HYDRODIURIL) 25 MG tablet Take 25 mg by mouth daily.        Marland Kitchen HYDROcodone-acetaminophen (VICODIN) 5-500 MG per tablet Take 1 tablet by mouth as needed.        . metFORMIN (GLUCOPHAGE) 500 MG tablet Take 500 mg by mouth 2 (two) times daily with a meal.        . metoprolol (TOPROL-XL) 50 MG 24 hr tablet Take 50 mg by mouth daily.        . norethindrone (AYGESTIN) 5 MG tablet Take 5 mg by mouth daily. 1-15 days of each month         Review of Systems Review of Systems  Constitutional: Negative for fever, chills and unexpected weight change.  HENT: Negative for hearing loss, congestion, sore throat, trouble swallowing and voice change.   Eyes: Negative for visual disturbance.  Respiratory: Negative for cough, shortness of breath and wheezing.   Cardiovascular: Negative for chest pain, palpitations and leg  swelling.       No DOE, no orthopnea, no SOB, no PND  Gastrointestinal: Positive for nausea, abdominal pain, diarrhea and constipation. Negative for vomiting, blood in stool, abdominal distention and anal bleeding.  Genitourinary: Negative for hematuria, vaginal bleeding and difficulty urinating.  Musculoskeletal: Negative for arthralgias.  Skin: Negative for rash and wound.  Neurological: Negative for seizures, syncope and headaches.  Hematological: Negative for adenopathy. Does not bruise/bleed easily.  Psychiatric/Behavioral: Negative for confusion.    Blood pressure 134/92, pulse 76, temperature 97.8 F (36.6 C), height 5\' 4"  (1.626 m), weight 193 lb (87.544 kg).  Physical Exam Physical Exam  Vitals reviewed. Constitutional: She is oriented to  person, place, and time. She appears well-developed and well-nourished. No distress.  HENT:  Head: Normocephalic and atraumatic.  Eyes: Conjunctivae are normal. No scleral icterus.  Neck: Normal range of motion. Neck supple. No tracheal deviation present. No thyromegaly present.  Cardiovascular: Normal rate, regular rhythm and normal heart sounds.   Pulmonary/Chest: Effort normal and breath sounds normal. No respiratory distress.  Abdominal: Soft. Bowel sounds are normal. She exhibits no distension. There is no tenderness. There is no guarding.  Musculoskeletal: Normal range of motion. She exhibits no edema.  Neurological: She is alert and oriented to person, place, and time.  Skin: Skin is warm and dry. No rash noted.       No jaundice  Psychiatric: She has a normal mood and affect. Her behavior is normal. Judgment and thought content normal.    Data Reviewed COMPLETE ABDOMINAL ULTRASOUND 04/2011 Comparison: CT scan 03/18/2008.  Findings:  Gallbladder: Numerous echogenic gallstones are present with  associated acoustic shadowing. The largest measures approximately  16 mm. No gallbladder wall thickening or pericholecystic fluid.  Equivocal sonographic Murphy's sign.   Common bile duct: Normal in caliber measuring a maximum of 3.43mm.   Liver: Diffuse increased echogenicity suggesting fatty  infiltration. Probable areas of focal fatty sparing involving the  caudate lobe, gallbladder fossa and right hepatic lobe. Looking  back at the prior CT examinations these findings are stable. The  smaller nodular lesion in the right lobe is not identified for  certain on the prior CT scan and I would recommend a follow-up  abdominal ultrasound examination in 6 months to confer stability.  No biliary dilatation.   IVC: Normal caliber.  Pancreas: Sonographically normal.  Spleen: Normal size and echogenicity without focal lesions.  Right Kidney: 12.0 cm in length. Normal renal cortical thickness   and echogenicity without focal lesions or hydronephrosis.  Left Kidney: 13.4 cm in length. Normal renal cortical thickness  and echogenicity without focal lesions or hydronephrosis.  Abdominal aorta: Normal caliber.   IMPRESSION:  1. Cholelithiasis without sonographic findings for acute  cholecystitis. Equivocal sonographic Murphy's sign.  2. Hepatic abnormalities as discussed above are likely due to  focal fatty sparing in a background of fatty infiltration.  Recommend follow-up ultrasound examination in 6 months.  3. Normal sonographic appearance of the pancreas, spleen and both  kidneys.   Assessment    Symptomatic cholelithiasis Diabetes Mellitus Dyslipidemia    Plan    We discussed gallbladder disease. The patient was given Agricultural engineer. We discussed non-operative and operative management.   I discussed laparoscopic cholecystectomy with cholangiogram in detail.  The patient was given educational material as well as diagrams detailing the procedure.  We discussed the risks and benefits of a laparoscopic cholecystectomy including, but not limited to bleeding, infection, injury to surrounding structures such as the intestine  or liver, bile leak, retained gallstones, need to convert to an open procedure, prolonged diarrhea, blood clots such as  DVT, common bile duct injury, anesthesia risks, and possible need for additional procedures.  We discussed the typical post-operative recovery course. I explained that the likelihood of improvement of their symptoms is good.  The patient has elected to proceed with laparoscopic cholecystectomy with cholangiogram.  Mary Sella. Andrey Campanile, MD, FACS        Gaynelle Adu M 05/22/2011, 2:20 PM

## 2011-05-31 NOTE — Anesthesia Postprocedure Evaluation (Signed)
  Anesthesia Post-op Note  Patient: Christina Howard  Procedure(s) Performed:  LAPAROSCOPIC CHOLECYSTECTOMY WITH INTRAOPERATIVE CHOLANGIOGRAM  Patient Location: PACU  Anesthesia Type: General  Level of Consciousness: awake and alert   Airway and Oxygen Therapy: Patient Spontanous Breathing  Post-op Pain: mild  Post-op Assessment: Post-op Vital signs reviewed, Patient's Cardiovascular Status Stable, Respiratory Function Stable, Patent Airway and No signs of Nausea or vomiting  Post-op Vital Signs: stable  Complications: No apparent anesthesia complications

## 2011-05-31 NOTE — Anesthesia Preprocedure Evaluation (Addendum)
Anesthesia Evaluation  Patient identified by MRN, date of birth, ID band Patient awake    Reviewed: Allergy & Precautions, H&P , NPO status , Patient's Chart, lab work & pertinent test results  Airway  TM Distance: >3 FB Neck ROM: Full    Dental No notable dental hx. (+) Teeth Intact and Dental Advisory Given   Pulmonary neg pulmonary ROS,    Pulmonary exam normal       Cardiovascular hypertension, Pt. on medications neg cardio ROS     Neuro/Psych Negative Neurological ROS  Negative Psych ROS   GI/Hepatic negative GI ROS, Neg liver ROS,   Endo/Other  Negative Endocrine ROSDiabetes mellitus-, Well Controlled, Type 2, Oral Hypoglycemic Agents  Renal/GU negative Renal ROS  Genitourinary negative   Musculoskeletal negative musculoskeletal ROS (+)   Abdominal Normal abdominal exam  (+)   Peds negative pediatric ROS (+)  Hematology negative hematology ROS (+)   Anesthesia Other Findings   Reproductive/Obstetrics negative OB ROS                          Anesthesia Physical Anesthesia Plan  ASA: III  Anesthesia Plan: General   Post-op Pain Management:    Induction: Intravenous  Airway Management Planned: Oral ETT  Additional Equipment:   Intra-op Plan:   Post-operative Plan:   Informed Consent: I have reviewed the patients History and Physical, chart, labs and discussed the procedure including the risks, benefits and alternatives for the proposed anesthesia with the patient or authorized representative who has indicated his/her understanding and acceptance.   Dental advisory given  Plan Discussed with: CRNA and Surgeon  Anesthesia Plan Comments:         Anesthesia Quick Evaluation

## 2011-05-31 NOTE — OR Nursing (Signed)
Up to BR with help and voided very small amt urine

## 2011-06-01 ENCOUNTER — Telehealth (INDEPENDENT_AMBULATORY_CARE_PROVIDER_SITE_OTHER): Payer: Self-pay

## 2011-06-01 NOTE — Telephone Encounter (Signed)
I told the pt to stop the Percocet and I will call in Vicodin.  She has a script already for Vicodin from [facility].  I told her to use that.  She said also her husband had called to report a fever which is now 101.2.  I let Dr Andrey Campanile know and he said she should be walking around, taking deep breaths and could take Tylenol.  He asked if she has any nausea or vomiting or worse pain.  She does not.  I told her to be careful taking too much Tylenol with the Vicodin but she can take ibuprofen.  She will walk around and take deep breaths.  She will call if temp 101.5 or greater.

## 2011-06-01 NOTE — Telephone Encounter (Signed)
Have pt stop taking percocet.  Give her  Our standard rx for vicodin.

## 2011-06-01 NOTE — Telephone Encounter (Signed)
Patient is po lap chole from yesterday.  She states the Oxycodone is making her itch all over.  There is no rash or bumps.  She has no fever and no nausea or vomiting.  She took Benadryl last night which helped.   I advised her to take some more Benadryl today because the Oxycodone is helping her pain.  I will let Dr Andrey Campanile know in case he wants to change the medicine.

## 2011-06-02 ENCOUNTER — Encounter (HOSPITAL_COMMUNITY): Payer: Self-pay | Admitting: General Surgery

## 2011-06-14 ENCOUNTER — Encounter (INDEPENDENT_AMBULATORY_CARE_PROVIDER_SITE_OTHER): Payer: Self-pay | Admitting: General Surgery

## 2011-06-16 ENCOUNTER — Ambulatory Visit (INDEPENDENT_AMBULATORY_CARE_PROVIDER_SITE_OTHER): Payer: PRIVATE HEALTH INSURANCE | Admitting: General Surgery

## 2011-06-16 ENCOUNTER — Encounter (INDEPENDENT_AMBULATORY_CARE_PROVIDER_SITE_OTHER): Payer: Self-pay | Admitting: General Surgery

## 2011-06-16 VITALS — BP 140/92 | HR 64 | Temp 97.7°F | Resp 16 | Ht 64.0 in | Wt 188.0 lb

## 2011-06-16 DIAGNOSIS — Z9049 Acquired absence of other specified parts of digestive tract: Secondary | ICD-10-CM

## 2011-06-16 DIAGNOSIS — Z09 Encounter for follow-up examination after completed treatment for conditions other than malignant neoplasm: Secondary | ICD-10-CM

## 2011-06-16 DIAGNOSIS — Z9889 Other specified postprocedural states: Secondary | ICD-10-CM

## 2011-06-16 NOTE — Patient Instructions (Signed)
Can resume full activities.  Try adding some fiber to your diet

## 2011-06-16 NOTE — Progress Notes (Signed)
Chief complaint: Postop  Procedure: Status post laparoscopic cholecystectomy with intraoperative cholangiogram on May 31, 2011  History of Present Ilness: 45 year old Caucasian female comes in for her postoperative appointment. She states that she has been doing well. She had a little bit of nausea for one to 2 days after surgery. She denies any fevers, chills, abdominal pain, incisional problems, or constipation. She's had one or 2 episodes of loose stools which have been intermittent in nature. She is no longer taking pain medication. Her appetite is normal.  Physical Exam: BP 140/92  Pulse 64  Temp(Src) 97.7 F (36.5 C) (Temporal)  Resp 16  Ht 5\' 4"  (1.626 m)  Wt 188 lb (85.276 kg)  BMI 32.27 kg/m2  LMP 05/05/2011  Well-developed well-nourished Caucasian female in no apparent distress HEENT: Atraumatic, normocephalic, pupils equal, no icterus Pulmonary-lungs are clear Cardiac-regular rate and rhythm Abdomen-soft, nontender, nondistended. Well-healed trocar incisions. No signs of infection. No signs of incisional hernia.  Pathology: Gallbladder showed chronic cholecystitis and cholelithiasis  Assessment and Plan: Status post laparoscopic cholecystectomy with intraoperative cholangiogram doing well  We discussed her pathology report. I've released her to full activities. I encouraged her to have some fiber to her diet. I will see her on a p.r.n. Basis  Mary Sella. Andrey Campanile, MD, FACS General, Bariatric, & Minimally Invasive Surgery [facility], [place]

## 2011-07-05 ENCOUNTER — Encounter (HOSPITAL_COMMUNITY): Payer: Self-pay

## 2011-07-15 ENCOUNTER — Encounter (HOSPITAL_COMMUNITY)
Admission: RE | Admit: 2011-07-15 | Discharge: 2011-07-15 | Disposition: A | Discharge status: Home / Self Care | Payer: PRIVATE HEALTH INSURANCE | Source: Ambulatory Visit | Attending: Obstetrics and Gynecology | Admitting: Obstetrics and Gynecology

## 2011-07-15 ENCOUNTER — Encounter (HOSPITAL_COMMUNITY): Payer: Self-pay

## 2011-07-15 ENCOUNTER — Inpatient Hospital Stay (HOSPITAL_COMMUNITY): Admission: RE | Admit: 2011-07-15 | Payer: PRIVATE HEALTH INSURANCE | Source: Ambulatory Visit

## 2011-07-15 HISTORY — DX: Other complications of anesthesia, initial encounter: T88.59XA

## 2011-07-15 HISTORY — DX: Adverse effect of unspecified anesthetic, initial encounter: T41.45XA

## 2011-07-15 LAB — SURGICAL PCR SCREEN
MRSA, PCR: NEGATIVE
Staphylococcus aureus: NEGATIVE

## 2011-07-15 LAB — COMPREHENSIVE METABOLIC PANEL
Alkaline Phosphatase: 105 U/L (ref 39–117)
BUN: 13 mg/dL (ref 6–23)
Calcium: 10.5 mg/dL (ref 8.4–10.5)
Creatinine, Ser: 0.69 mg/dL (ref 0.50–1.10)
GFR calc Af Amer: >90 mL/min (ref >90)
Glucose, Bld: 173 mg/dL — ABNORMAL HIGH (ref 70–99)
Potassium: 4 mEq/L (ref 3.5–5.1)
Total Protein: 7.8 g/dL (ref 6.0–8.3)

## 2011-07-15 LAB — CBC
HCT: 36.9 % (ref 36.0–46.0)
Hemoglobin: 11.9 g/dL — ABNORMAL LOW (ref 12.0–15.0)
MCH: 27.1 pg (ref 26.0–34.0)
MCHC: 32.2 g/dL (ref 30.0–36.0)
MCV: 84.1 fL (ref 78.0–100.0)

## 2011-07-15 NOTE — Patient Instructions (Signed)
YOUR PROCEDURE IS SCHEDULED ON:07/19/11  ENTER THROUGH THE MAIN ENTRANCE OF [facility] AT:6am  USE DESK PHONE AND DIAL [id number] TO INFORM us OF YOUR ARRIVAL  CALL [phone] IF YOU HAVE ANY QUESTIONS OR PROBLEMS PRIOR TO YOUR ARRIVAL.  REMEMBER: DO NOT EAT OR DRINK AFTER MIDNIGHT :Sunday  SPECIAL INSTRUCTIONS:hold Metformin for 24 hrs   YOU MAY BRUSH YOUR TEETH THE MORNING OF SURGERY   TAKE THESE MEDICINES THE DAY OF SURGERY WITH SIP OF WATER:   DO NOT WEAR JEWELRY, EYE MAKEUP, LIPSTICK OR DARK FINGERNAIL POLISH DO NOT WEAR LOTIONS  DO NOT SHAVE FOR 48 HOURS PRIOR TO SURGERY  YOU WILL NOT BE ALLOWED TO DRIVE YOURSELF HOME.  NAME OF DRIVER:Archie

## 2011-07-18 MED ORDER — GENTAMICIN SULFATE 40 MG/ML IJ SOLN
INTRAMUSCULAR | Status: AC
  Administered 2011-07-19: 100 mL via INTRAVENOUS
  Filled 2011-07-18: qty 2.5

## 2011-07-18 NOTE — H&P (Addendum)
Christina Howard is an 45 y.o. female.G1P1 who presents for scheduled LAVH/anterior and posterior repair for issues with menorrhagia and concurrent pelvic prolapse.  She had a prior ablation one year ago and had relief for about 6 months but now menses have resumed heavy flow and pain lasting about 10 days.  She would like definitive surgical therapy.  She has no significant problems with urinary incontinence. Pertinent Gynecological History: Contraception: vasectomy Previous GYN Procedures: Novasure ablation/hysteroscopy  Last mammogram: normal  Last pap: normal OB History: G1P1 with NSVDx 1   Menstrual History:  No LMP recorded.    Past Medical History  Diagnosis Date  . Diabetes mellitus   . Hyperlipidemia   . Hypertension   . Obesity (BMI 30-39.9)   . Gallstones   . Arthritis   . Anemia   . Kidney stone   . Complication of anesthesia     pt states she had hot flashes for a week after surgery    Past Surgical History  Procedure Date  . Knee scopes 2003    total  . Nasal sinus surgery 2002  . Barthalmus glands removed 1989  . Endometrial ablation   . Cholecystectomy 05/31/2011    Procedure: LAPAROSCOPIC CHOLECYSTECTOMY WITH INTRAOPERATIVE CHOLANGIOGRAM;  Surgeon: [name], MD;  Location: [facility];  Service: General;  Laterality: N/A;    Family History  Problem Relation Age of Onset  . Hypertension Mother   . Hyperlipidemia Mother   . Diabetes Father   . Hyperlipidemia Father   . Hypertension Father     Social History:  reports that she has never smoked. She does not have any smokeless tobacco history on file. She reports that she drinks alcohol. She reports that she does not use illicit drugs.  Allergies:  Allergies  Allergen Reactions  . Oxycodone Hcl Itching    Makes pt itch after 2 days.  . Penicillins     Yeast in mouth  . Sulfa Drugs Cross Reactors     Headache     Meds Metformin Aygestin Lipitor HCTZ toprol  ROS  There were no vitals  taken for this visit. Physical Exam  Constitutional: She is oriented to person, place, and time. She appears well-developed and well-nourished.  Cardiovascular: Normal rate and regular rhythm.   Respiratory: Effort normal and breath sounds normal.  GI: Soft. Bowel sounds are normal.  Genitourinary: Vagina normal and uterus normal.       Uterus normal size with good cervical descensus Small cystocele Moderate rectocele  Neurological: She is alert and oriented to person, place, and time.  Psychiatric: She has a normal mood and affect. Her behavior is normal.    No results found for this or any previous visit (from the past 24 hour(s)).  No results found.  Assessment/Plan: The patient was counseled regarding the risks of surgery including bleeding,infection and possible damage to bowel and bladder.  She understands that in the event of a complication, she would need an abdominal incision which would delay her recovery.  She desires to proceed.    Oliver Pila 07/18/2011, 6:55 PM  Per pt no changes in dictated H&P, brief exam WNL.  Pt BS 180 but was instructed not to take metformin for 24 hours.

## 2011-07-19 ENCOUNTER — Encounter (HOSPITAL_COMMUNITY): Payer: Self-pay | Admitting: *Deleted

## 2011-07-19 ENCOUNTER — Encounter (HOSPITAL_COMMUNITY)
Admission: RE | Disposition: A | Discharge status: Home / Self Care | Payer: Self-pay | Source: Ambulatory Visit | Attending: Obstetrics and Gynecology

## 2011-07-19 ENCOUNTER — Inpatient Hospital Stay (HOSPITAL_COMMUNITY): Payer: PRIVATE HEALTH INSURANCE | Admitting: Anesthesiology

## 2011-07-19 ENCOUNTER — Encounter (HOSPITAL_COMMUNITY): Payer: Self-pay | Admitting: Anesthesiology

## 2011-07-19 ENCOUNTER — Ambulatory Visit (HOSPITAL_COMMUNITY)
Admission: RE | Admit: 2011-07-19 | Discharge: 2011-07-20 | Disposition: A | Discharge status: Home / Self Care | Payer: PRIVATE HEALTH INSURANCE | Source: Ambulatory Visit | Attending: Obstetrics and Gynecology | Admitting: Obstetrics and Gynecology

## 2011-07-19 ENCOUNTER — Other Ambulatory Visit: Payer: Self-pay | Admitting: Obstetrics and Gynecology

## 2011-07-19 DIAGNOSIS — N812 Incomplete uterovaginal prolapse: Secondary | ICD-10-CM | POA: Insufficient documentation

## 2011-07-19 DIAGNOSIS — N946 Dysmenorrhea, unspecified: Secondary | ICD-10-CM | POA: Insufficient documentation

## 2011-07-19 DIAGNOSIS — Z90710 Acquired absence of both cervix and uterus: Secondary | ICD-10-CM

## 2011-07-19 DIAGNOSIS — Z01812 Encounter for preprocedural laboratory examination: Secondary | ICD-10-CM | POA: Insufficient documentation

## 2011-07-19 DIAGNOSIS — N8000 Endometriosis of the uterus, unspecified: Secondary | ICD-10-CM | POA: Insufficient documentation

## 2011-07-19 DIAGNOSIS — N920 Excessive and frequent menstruation with regular cycle: Secondary | ICD-10-CM | POA: Insufficient documentation

## 2011-07-19 DIAGNOSIS — Z01818 Encounter for other preprocedural examination: Secondary | ICD-10-CM | POA: Insufficient documentation

## 2011-07-19 HISTORY — PX: RECTOCELE REPAIR: SHX761

## 2011-07-19 HISTORY — PX: LAPAROSCOPIC ASSISTED VAGINAL HYSTERECTOMY: SHX5398

## 2011-07-19 HISTORY — DX: Excessive and frequent menstruation with regular cycle: N92.0

## 2011-07-19 LAB — ABO/RH: ABO/RH(D): A POS

## 2011-07-19 LAB — TYPE AND SCREEN: Antibody Screen: NEGATIVE

## 2011-07-19 LAB — GLUCOSE, CAPILLARY: Glucose-Capillary: 180 mg/dL — ABNORMAL HIGH (ref 70–99)

## 2011-07-19 SURGERY — HYSTERECTOMY, VAGINAL, LAPAROSCOPY-ASSISTED
Anesthesia: General

## 2011-07-19 MED ORDER — DIPHENHYDRAMINE HCL 50 MG/ML IJ SOLN: 12.5000 mg | Freq: Four times a day (QID) | INTRAMUSCULAR | Status: DC | PRN

## 2011-07-19 MED ORDER — HYDROCODONE-ACETAMINOPHEN 5-325 MG PO TABS
1.0000 | ORAL_TABLET | ORAL | Status: DC | PRN
  Administered 2011-07-20: 1 via ORAL
  Filled 2011-07-19: qty 2

## 2011-07-19 MED ORDER — HYDROMORPHONE 0.3 MG/ML IV SOLN
INTRAVENOUS | Status: DC
  Administered 2011-07-19: 1.39 mg via INTRAVENOUS
  Administered 2011-07-19: 2.79 mg via INTRAVENOUS
  Administered 2011-07-19: 1.19 mg via INTRAVENOUS
  Administered 2011-07-19: 11:00:00 via INTRAVENOUS
  Administered 2011-07-20: 0.799 mg via INTRAVENOUS

## 2011-07-19 MED ORDER — MENTHOL 3 MG MT LOZG: 1.0000 | LOZENGE | OROMUCOSAL | Status: DC | PRN

## 2011-07-19 MED ORDER — VASOPRESSIN 20 UNIT/ML IJ SOLN
INTRAVENOUS | Status: DC | PRN
  Administered 2011-07-19: 08:00:00 via INTRAMUSCULAR

## 2011-07-19 MED ORDER — LACTATED RINGERS IV SOLN
INTRAVENOUS | Status: DC
  Administered 2011-07-19 (×4): via INTRAVENOUS

## 2011-07-19 MED ORDER — ROCURONIUM BROMIDE 100 MG/10ML IV SOLN
INTRAVENOUS | Status: DC | PRN
  Administered 2011-07-19: 35 mg via INTRAVENOUS
  Administered 2011-07-19: 5 mg via INTRAVENOUS
  Administered 2011-07-19 (×3): 10 mg via INTRAVENOUS

## 2011-07-19 MED ORDER — HYDROMORPHONE HCL PF 1 MG/ML IJ SOLN
INTRAMUSCULAR | Status: AC
  Administered 2011-07-19: 0.5 mg via INTRAVENOUS
  Filled 2011-07-19: qty 1

## 2011-07-19 MED ORDER — LACTATED RINGERS IV SOLN
INTRAVENOUS | Status: DC
  Administered 2011-07-19 – 2011-07-20 (×2): via INTRAVENOUS

## 2011-07-19 MED ORDER — DIPHENHYDRAMINE HCL 12.5 MG/5ML PO ELIX: 12.5000 mg | ORAL_SOLUTION | Freq: Four times a day (QID) | ORAL | Status: DC | PRN

## 2011-07-19 MED ORDER — ROCURONIUM BROMIDE 50 MG/5ML IV SOLN
INTRAVENOUS | Status: AC
  Filled 2011-07-19: qty 1

## 2011-07-19 MED ORDER — HYDROMORPHONE 0.3 MG/ML IV SOLN
INTRAVENOUS | Status: AC
  Filled 2011-07-19: qty 25

## 2011-07-19 MED ORDER — MIDAZOLAM HCL 5 MG/5ML IJ SOLN
INTRAMUSCULAR | Status: DC | PRN
  Administered 2011-07-19: 2 mg via INTRAVENOUS

## 2011-07-19 MED ORDER — METOPROLOL SUCCINATE ER 50 MG PO TB24
50.0000 mg | ORAL_TABLET | Freq: Every day | ORAL | Status: DC
  Administered 2011-07-19: 50 mg via ORAL
  Filled 2011-07-19: qty 1

## 2011-07-19 MED ORDER — SIMETHICONE 80 MG PO CHEW: 80.0000 mg | CHEWABLE_TABLET | Freq: Four times a day (QID) | ORAL | Status: DC | PRN

## 2011-07-19 MED ORDER — METFORMIN HCL 500 MG PO TABS
1000.0000 mg | ORAL_TABLET | Freq: Two times a day (BID) | ORAL | Status: DC
  Administered 2011-07-20: 1000 mg via ORAL
  Filled 2011-07-19 (×4): qty 2

## 2011-07-19 MED ORDER — ONDANSETRON HCL 4 MG/2ML IJ SOLN: 4.0000 mg | Freq: Four times a day (QID) | INTRAMUSCULAR | Status: DC | PRN

## 2011-07-19 MED ORDER — ONDANSETRON HCL 4 MG PO TABS: 4.0000 mg | ORAL_TABLET | Freq: Four times a day (QID) | ORAL | Status: DC | PRN

## 2011-07-19 MED ORDER — SENNA 8.6 MG PO TABS
1.0000 | ORAL_TABLET | Freq: Two times a day (BID) | ORAL | Status: DC
  Administered 2011-07-19 – 2011-07-20 (×2): 8.6 mg via ORAL
  Filled 2011-07-19 (×3): qty 1

## 2011-07-19 MED ORDER — BISACODYL 10 MG RE SUPP: 10.0000 mg | Freq: Every day | RECTAL | Status: DC | PRN

## 2011-07-19 MED ORDER — NEOSTIGMINE METHYLSULFATE 1 MG/ML IJ SOLN
INTRAMUSCULAR | Status: DC | PRN
  Administered 2011-07-19 (×2): 2 mg via INTRAVENOUS

## 2011-07-19 MED ORDER — LIDOCAINE HCL (CARDIAC) 20 MG/ML IV SOLN
INTRAVENOUS | Status: DC | PRN
  Administered 2011-07-19: 60 mg via INTRAVENOUS

## 2011-07-19 MED ORDER — HYDROCHLOROTHIAZIDE 25 MG PO TABS
25.0000 mg | ORAL_TABLET | ORAL | Status: DC
  Administered 2011-07-20: 25 mg via ORAL
  Filled 2011-07-19 (×2): qty 1

## 2011-07-19 MED ORDER — ONDANSETRON HCL 4 MG/2ML IJ SOLN
INTRAMUSCULAR | Status: DC | PRN
  Administered 2011-07-19: 4 mg via INTRAVENOUS

## 2011-07-19 MED ORDER — KETOROLAC TROMETHAMINE 30 MG/ML IJ SOLN
INTRAMUSCULAR | Status: DC | PRN
  Administered 2011-07-19: 30 mg via INTRAVENOUS

## 2011-07-19 MED ORDER — PROPOFOL 10 MG/ML IV EMUL
INTRAVENOUS | Status: DC | PRN
  Administered 2011-07-19: 20 mg via INTRAVENOUS
  Administered 2011-07-19: 180 mg via INTRAVENOUS

## 2011-07-19 MED ORDER — BUPIVACAINE HCL (PF) 0.25 % IJ SOLN
INTRAMUSCULAR | Status: DC | PRN
  Administered 2011-07-19: 10 mL

## 2011-07-19 MED ORDER — GLYCOPYRROLATE 0.2 MG/ML IJ SOLN
INTRAMUSCULAR | Status: DC | PRN
  Administered 2011-07-19: .8 mg via INTRAVENOUS

## 2011-07-19 MED ORDER — NALOXONE HCL 0.4 MG/ML IJ SOLN: 0.4000 mg | INTRAMUSCULAR | Status: DC | PRN

## 2011-07-19 MED ORDER — HYDROMORPHONE HCL PF 1 MG/ML IJ SOLN
0.2500 mg | INTRAMUSCULAR | Status: DC | PRN
  Administered 2011-07-19 (×2): 0.5 mg via INTRAVENOUS

## 2011-07-19 MED ORDER — MIDAZOLAM HCL 2 MG/2ML IJ SOLN
INTRAMUSCULAR | Status: AC
  Filled 2011-07-19: qty 2

## 2011-07-19 MED ORDER — FENTANYL CITRATE 0.05 MG/ML IJ SOLN
INTRAMUSCULAR | Status: AC
  Filled 2011-07-19: qty 2

## 2011-07-19 MED ORDER — CLINDAMYCIN PHOSPHATE 600 MG/50ML IV SOLN
INTRAVENOUS | Status: DC | PRN
  Administered 2011-07-19: 900 mg via INTRAVENOUS

## 2011-07-19 MED ORDER — CLINDAMYCIN PHOSPHATE 900 MG/50ML IV SOLN
900.0000 mg | Freq: Once | INTRAVENOUS | Status: DC
  Filled 2011-07-19: qty 50

## 2011-07-19 MED ORDER — ONDANSETRON HCL 4 MG/2ML IJ SOLN
INTRAMUSCULAR | Status: AC
  Filled 2011-07-19: qty 2

## 2011-07-19 MED ORDER — LIDOCAINE HCL (CARDIAC) 20 MG/ML IV SOLN
INTRAVENOUS | Status: AC
  Filled 2011-07-19: qty 5

## 2011-07-19 MED ORDER — PROPOFOL 10 MG/ML IV EMUL
INTRAVENOUS | Status: AC
  Filled 2011-07-19: qty 20

## 2011-07-19 MED ORDER — IBUPROFEN 600 MG PO TABS
600.0000 mg | ORAL_TABLET | Freq: Four times a day (QID) | ORAL | Status: DC | PRN
  Administered 2011-07-20: 600 mg via ORAL
  Filled 2011-07-19: qty 1

## 2011-07-19 MED ORDER — FENTANYL CITRATE 0.05 MG/ML IJ SOLN
INTRAMUSCULAR | Status: AC
  Filled 2011-07-19: qty 5

## 2011-07-19 MED ORDER — GLYCOPYRROLATE 0.2 MG/ML IJ SOLN
INTRAMUSCULAR | Status: AC
  Filled 2011-07-19: qty 1

## 2011-07-19 MED ORDER — FENTANYL CITRATE 0.05 MG/ML IJ SOLN
INTRAMUSCULAR | Status: DC | PRN
  Administered 2011-07-19: 100 ug via INTRAVENOUS
  Administered 2011-07-19: 150 ug via INTRAVENOUS

## 2011-07-19 MED ORDER — SODIUM CHLORIDE 0.9 % IJ SOLN: 9.0000 mL | INTRAMUSCULAR | Status: DC | PRN

## 2011-07-19 MED ORDER — NEOSTIGMINE METHYLSULFATE 1 MG/ML IJ SOLN
INTRAMUSCULAR | Status: AC
  Filled 2011-07-19: qty 10

## 2011-07-19 SURGICAL SUPPLY — 37 items
ADH SKN CLS APL DERMABOND .7 (GAUZE/BANDAGES/DRESSINGS) ×1
CABLE HIGH FREQUENCY MONO STRZ (ELECTRODE) IMPLANT
CATH ROBINSON RED A/P 16FR (CATHETERS) IMPLANT
CHLORAPREP W/TINT 26ML (MISCELLANEOUS) ×2 IMPLANT
CLOTH BEACON ORANGE TIMEOUT ST (SAFETY) ×2 IMPLANT
CONT PATH 16OZ SNAP LID 3702 (MISCELLANEOUS) ×2 IMPLANT
COVER TABLE BACK 60X90 (DRAPES) ×2 IMPLANT
DECANTER SPIKE VIAL GLASS SM (MISCELLANEOUS) IMPLANT
DERMABOND ADVANCED (GAUZE/BANDAGES/DRESSINGS) ×1
DERMABOND ADVANCED .7 DNX12 (GAUZE/BANDAGES/DRESSINGS) IMPLANT
DRAPE CAMERA CLOSED 9X96 (DRAPES) IMPLANT
ELECT REM PT RETURN 9FT ADLT (ELECTROSURGICAL)
ELECTRODE REM PT RTRN 9FT ADLT (ELECTROSURGICAL) IMPLANT
GAUZE PACKING 1/2 X5 YD (GAUZE/BANDAGES/DRESSINGS) ×1 IMPLANT
GLOVE BIOGEL PI IND STRL 6.5 (GLOVE) ×1 IMPLANT
GLOVE BIOGEL PI INDICATOR 6.5 (GLOVE) ×1
GOWN PREVENTION PLUS LG XLONG (DISPOSABLE) ×4 IMPLANT
NDL INSUFFLATION 14GA 120MM (NEEDLE) ×1 IMPLANT
NEEDLE INSUFFLATION 14GA 120MM (NEEDLE) ×2 IMPLANT
NS IRRIG 1000ML POUR BTL (IV SOLUTION) ×2 IMPLANT
PACK LAVH (CUSTOM PROCEDURE TRAY) ×2 IMPLANT
SCALPEL HARMONIC ACE (MISCELLANEOUS) ×2 IMPLANT
SET IRRIG TUBING LAPAROSCOPIC (IRRIGATION / IRRIGATOR) ×2 IMPLANT
SLEEVE Z-THREAD 5X100MM (TROCAR) ×4 IMPLANT
STRIP CLOSURE SKIN 1/4X3 (GAUZE/BANDAGES/DRESSINGS) IMPLANT
SUT SILK 0 FSL (SUTURE) ×2 IMPLANT
SUT VIC AB 0 CT1 18XCR BRD8 (SUTURE) ×3 IMPLANT
SUT VIC AB 0 CT1 8-18 (SUTURE) ×6
SUT VIC AB 2-0 CT1 (SUTURE) ×2 IMPLANT
SUT VICRYL 0 TIES 12 18 (SUTURE) IMPLANT
SUT VICRYL 0 UR6 27IN ABS (SUTURE) IMPLANT
SUT VICRYL 4-0 PS2 18IN ABS (SUTURE) ×2 IMPLANT
TOWEL OR 17X24 6PK STRL BLUE (TOWEL DISPOSABLE) ×4 IMPLANT
TRAY FOLEY CATH 14FR (SET/KITS/TRAYS/PACK) ×2 IMPLANT
TROCAR Z-THREAD FIOS 5X100MM (TROCAR) ×3 IMPLANT
WARMER LAPAROSCOPE (MISCELLANEOUS) ×2 IMPLANT
WATER STERILE IRR 1000ML POUR (IV SOLUTION) ×2 IMPLANT

## 2011-07-19 NOTE — Anesthesia Preprocedure Evaluation (Addendum)
Anesthesia Evaluation    Airway Mallampati: III      Dental   Pulmonary Recent URI  (no cough, more like sinusitis), Resolved,          Cardiovascular hypertension (well controlled), On Medications     Neuro/Psych    GI/Hepatic Poorly Controlled,  Endo/Other  Diabetes mellitus-, Type 2  Renal/GU      Musculoskeletal   Abdominal   Peds  Hematology   Anesthesia Other Findings   Reproductive/Obstetrics                          Anesthesia Physical Anesthesia Plan  ASA: III  Anesthesia Plan: General   Post-op Pain Management:    Induction: Intravenous  Airway Management Planned: Oral ETT  Additional Equipment:   Intra-op Plan:   Post-operative Plan:   Informed Consent: I have reviewed the patients History and Physical, chart, labs and discussed the procedure including the risks, benefits and alternatives for the proposed anesthesia with the patient or authorized representative who has indicated his/her understanding and acceptance.   Dental Advisory Given and Dental advisory given  Plan Discussed with: CRNA and Surgeon  Anesthesia Plan Comments: (Concerned of PONV  Discussed  general anesthesia, including possible nausea, instrumentation of airway, sore throat,pulmonary aspiration, etc. I asked if the were any outstanding questions, or  concerns before we proceeded. )       Anesthesia Quick Evaluation

## 2011-07-19 NOTE — Anesthesia Procedure Notes (Signed)
Procedure Name: Intubation Date/Time: 07/19/2011 7:33 AM Performed by: Isabella Bowens Pre-anesthesia Checklist: Patient identified, Patient being monitored, Emergency Drugs available, Timeout performed and Suction available Patient Re-evaluated:Patient Re-evaluated prior to inductionOxygen Delivery Method: Circle System Utilized Preoxygenation: Pre-oxygenation with 100% oxygen Intubation Type: IV induction Ventilation: Mask ventilation without difficulty Laryngoscope Size: Mac and 3 Grade View: Grade I Tube type: Oral Tube size: 7.0 mm Number of attempts: 1 Airway Equipment and Method: patient positioned with wedge pillow and stylet Placement Confirmation: positive ETCO2 and breath sounds checked- equal and bilateral Secured at: 21 cm Tube secured with: Tape Dental Injury: Teeth and Oropharynx as per pre-operative assessment  Difficulty Due To: Difficulty was anticipated and Difficult Airway- due to anterior larynx

## 2011-07-19 NOTE — Anesthesia Postprocedure Evaluation (Signed)
Anesthesia Post Note  Patient: Christina Howard  Procedure(s) Performed:  LAPAROSCOPIC ASSISTED VAGINAL HYSTERECTOMY; POSTERIOR REPAIR (RECTOCELE)  Anesthesia type: GA  Patient location: PACU  Post pain: Pain level controlled  Post assessment: Post-op Vital signs reviewed  Last Vitals:  Filed Vitals:   07/19/11 0951  BP:   Pulse:   Temp: 36.6 C  Resp: 24    Post vital signs: Reviewed  Level of consciousness: sedated  Complications: No apparent anesthesia complications

## 2011-07-19 NOTE — Anesthesia Postprocedure Evaluation (Signed)
  Anesthesia Post-op Note  Patient: Christina Howard  Procedure(s) Performed:  LAPAROSCOPIC ASSISTED VAGINAL HYSTERECTOMY; POSTERIOR REPAIR (RECTOCELE)  Patient Location: [facility]  Anesthesia Type: General  Level of Consciousness: awake  Airway and Oxygen Therapy: Patient Spontanous Breathing  Post-op Pain: mild  Post-op Assessment: Post-op Vital signs reviewed  Post-op Vital Signs: Reviewed  Complications: No apparent anesthesia complications

## 2011-07-19 NOTE — Brief Op Note (Signed)
07/19/2011  9:44 AM  PATIENT:  Christina Howard  45 y.o. female  PRE-OPERATIVE DIAGNOSIS:  menorrhagia, pelvic pain,   POST-OPERATIVE DIAGNOSIS:  menorrhagia, pelvic pain, endometriosis  PROCEDURE:  Procedure(s): LAPAROSCOPIC ASSISTED VAGINAL HYSTERECTOMY POSTERIOR REPAIR (RECTOCELE)  SURGEON:  Surgeon(s): Greig Castilla, MD  ANESTHESIA:   general  EBL:  Total I/O In: 1000 [I.V.:1000] Out: 300 [Urine:100; Blood:200]  BLOOD ADMINISTERED:none  DRAINS: Urinary Catheter (Foley)   LOCAL MEDICATIONS USED:  MARCAINE 10CC, dilute pitressin  SPECIMEN:  Uterus and cervix  DISPOSITION OF SPECIMEN:  PATHOLOGY  COUNTS:  YES   DICTATION: .Dragon Dictation  PLAN OF CARE: Admit for overnight observation  PATIENT DISPOSITION:  PACU - hemodynamically stable.   Delay start of Pharmacological VTE agent (>24hrs) due to surgical blood loss or risk of bleeding:  {YES/NO/NOT APPLICABLE:20182

## 2011-07-19 NOTE — Transfer of Care (Signed)
Immediate Anesthesia Transfer of Care Note  Patient: Christina Howard  Procedure(s) Performed:  LAPAROSCOPIC ASSISTED VAGINAL HYSTERECTOMY; POSTERIOR REPAIR (RECTOCELE)  Patient Location: PACU  Anesthesia Type: General  Level of Consciousness: awake and sedated  Airway & Oxygen Therapy: Patient Spontanous Breathing and Patient connected to nasal cannula oxygen  Post-op Assessment: Report given to PACU RN and Post -op Vital signs reviewed and stable  Post vital signs: Reviewed and stable  Complications: No apparent anesthesia complications

## 2011-07-19 NOTE — Addendum Note (Signed)
Addendum  created 07/19/11 1613 by Cephus Shelling   Modules edited:Notes Section

## 2011-07-19 NOTE — Op Note (Signed)
Operative note  Preoperative diagnosis Menorrhagia Dysmenorrhea Status post NovaSure ablation Rectocele  Postoperative diagnosis Same with pelvic endometriosis noted  Procedure Laparoscopic-assisted vaginal hysterectomy Posterior repair  Surgeon Dr. Huel Cote Dr. Tawanna Cooler Meisinger  Anesthesia Gen.  Fluids Estimated blood loss 200 cc Urine output 100 cc IV fluids 2 L of LR  Findings The uterus was upper limits of normal in size. There were pelvic adhesions of the small and large bowel loosely adherent to the posterior uterine fundus which were taken down bluntly. There was pelvic endometriosis noted on the sidewalls both on the right and the left just overlying the area of the ureter. The ovaries and tubes appeared normal.  Specimen Uterus and cervix were sent to pathology  Procedure After informed consent was obtained from the patient she was taken to the operating room and general anesthesia obtained without difficulty. An appropriate time out was performed and she was then prepped and draped in the normal sterile fashion in the dorsal lithotomy position. A speculum was placed in the vagina and a Hulka tenaculum placed within the cervix for uterine manipulation a Foley catheter was of the bladder. Attention was turned to the patient's abdomen where the infraumbilical area was injected with quarter percent plain Marcaine. A small 1 cm incision was then made with the scalpel over pre-existing scar and the varies needle introduced into the incision with intraperitoneal placement confirmed by both aspiration and injection with normal saline. The Optiview 5 mm trocar was then utilized for direct visualization entry and the cavity entered without difficulty. With the camera in place the abdomen and pelvis were inspected with the findings of the adhesions and endometriosis in the pelvic area. The adhesions of the bowel were able to be pushed away bluntly as they were filmy adhesions  only. The pelvic endometriosis was mostly on the right and left sidewall just overlying the area of the ureter therefore was not cauterized. 2 additional trochars were then placed under direct visualization 5 mm in size in the upper left and right quadrant.  The uterus was then reflected medially and the utero-ovarian ligament fallopian tube and broad ligament taken down on the patient's right with the harmonic scalpel. The round ligament was then likewise transected with harmonic scalpel and the bladder flap developed in the midline. Attention was then turned to the patient's left where in a similar fashion the utero-ovarian ligament broad ligament and round ligament were all taken down with harmonic scalpel and the bladder flap developed admit the midline. This was then pushed away from the underlying cervix all areas were inspected and there was no active bleeding noted therefore all instruments were removed from the abdomen with the trochars left in place and attention turned to the vagina. The Hulka tenaculum was removed and a weighted speculum placed within the vagina. The cervix was grasped with [place] tenaculums x2 and a circumferential incision made with Bovie cautery around the cervical mucosa. This was then developed further with Mayo scissors and the vaginal mucosa pushed away from the underlying cervical tissue. The posterior cul-de-sac was then entered sharply and a banana speculum placed within it. The anterior cul-de-sac was  likewise opened sharply and the Sims retractor placed within it.  Thus with the bladder and the rectum isolated the uterosacral ligaments and paracervical tissue were taken down with parametrial clamps bilaterally. Each step was transected and suture ligated with 0 Vicryl this was carried up through the uterine arteries and the remaining broad ligament with each step secured  with a suture ligature of 0 Vicryl. [name] left side was completely free at this point and the  uterus was delivered into the vagina and the remaining pedicle on the patient's right taken down with parametrial clamp and suture-ligated. There is a small area of bleeding at the right angle which was controlled with a figure-of-eight suture of 0 Vicryl. No additional bleeding was noted. The banana speculum was then removed and a weighted speculum placed within the vagina. The posterior cuff was then run in a running locked suture with 2-0 Vicryl for hemostasis. The anterior portion of the vagina was inspected and no real cystocele identified with just some redundant vaginal tissue posteriorly therefore the vaginal cuff was closed with 2-0 Vicryl in a running fashion down to the level of the uterosacral ligaments. A interrupted suture of 0 Vicryl was then placed through the uterosacral ligaments and the vaginal cuff 4 extra-support. The remainder of the cuff was then closed and no active bleeding noted. The posterior vagina was redundant therefore the decision made to proceed with posterior repair. The vaginal mucosa was grasped with Allis clamps and the midline injected with a dilute solution of Pitressin. The vaginal mucosa was then underscored along the midline and the rectovaginal fascia dissected off the mucosal flaps. This was carried up to within 1 cm of the vaginal cuff. The rectocele and underlying tissue was dissected off the mucosal flaps and reflected medially. The rectovaginal fascia was then reinforced with several interrupted figure-of-eight sutures of 0 Vicryl. A rectal exam was performed and showed that the rectocele was reduced. The excess vaginal mucosa was then trimmed away with Mayo scissors and the tissue closed with 2-0 Vicryl in a running locked suture. No active bleeding was noted and the vagina was packed with an Estrace coated gauze. Gloves were changed and attention was returned to the patient's abdomen where pneumoperitoneum was once again obtained. The scope was placed within the  umbilical port and the bowel pushed away with a blood prior the cul-de-sac and vaginal cuff were year gaited and inspected with no active bleeding noted the ureters appeared normal in caliber and well away from the incision site on laparoscopic visualization. Again there is no bleeding and therefore all instruments were removed from the abdomen. The 5 mm trochars were removed under direct visualization on the right and the left and the umbilical port utilized to reduce the pneumoperitoneum. It was removed at the conclusion with no bleeding noted. All incision sites were closed with a subcutaneous suture of 4-0 Vicryl and Dermabond. Again all sponge lap and needle counts were correct and the patient was taken to the recovery room in good condition

## 2011-07-20 ENCOUNTER — Encounter (HOSPITAL_COMMUNITY): Payer: Self-pay | Admitting: Obstetrics and Gynecology

## 2011-07-20 DIAGNOSIS — N920 Excessive and frequent menstruation with regular cycle: Secondary | ICD-10-CM | POA: Diagnosis present

## 2011-07-20 HISTORY — DX: Excessive and frequent menstruation with regular cycle: N92.0

## 2011-07-20 LAB — CBC
HCT: 30 % — ABNORMAL LOW (ref 36.0–46.0)
Hemoglobin: 9.5 g/dL — ABNORMAL LOW (ref 12.0–15.0)
MCV: 86 fL (ref 78.0–100.0)
RBC: 3.49 MIL/uL — ABNORMAL LOW (ref 3.87–5.11)
WBC: 9.3 10*3/uL (ref 4.0–10.5)

## 2011-07-20 LAB — GLUCOSE, CAPILLARY: Glucose-Capillary: 184 mg/dL — ABNORMAL HIGH (ref 70–99)

## 2011-07-20 MED ORDER — HYDROMORPHONE 0.3 MG/ML IV SOLN
INTRAVENOUS | Status: AC
  Administered 2011-07-20: 0.599 mg via INTRAVENOUS
  Filled 2011-07-20: qty 25

## 2011-07-20 MED ORDER — IBUPROFEN 600 MG PO TABS: 600.0000 mg | ORAL_TABLET | Freq: Four times a day (QID) | ORAL | Status: AC | PRN

## 2011-07-20 MED ORDER — HYDROCODONE-ACETAMINOPHEN 5-325 MG PO TABS: 1.0000 | ORAL_TABLET | ORAL | Status: AC | PRN

## 2011-07-20 NOTE — Progress Notes (Signed)
1 Day Post-Op Procedure(s): LAPAROSCOPIC ASSISTED VAGINAL HYSTERECTOMY POSTERIOR REPAIR (RECTOCELE)  Subjective: Patient reports incisional pain, tolerating PO and no problems voiding.    Objective: I have reviewed patient's vital signs, intake and output and labs.  General: alert and no distress Resp: clear to auscultation bilaterally Cardio: regular rate and rhythm GI: soft, non-tender; bowel sounds normal; no masses,  no organomegaly Extremities: extremities normal, atraumatic, no cyanosis or edema Vaginal Bleeding: scant VB on vag pack   Assessment: s/p Procedure(s): LAPAROSCOPIC ASSISTED VAGINAL HYSTERECTOMY POSTERIOR REPAIR (RECTOCELE): stable, progressing well and tolerating diet  Plan: Advance diet Encourage ambulation Advance to PO medication Discontinue IV fluids Discharge home  LOS: 1 day    BOVARD,Atalie-Marie Rueger 07/20/2011, 10:01 AM

## 2011-07-20 NOTE — Progress Notes (Signed)
D/c instructions reviewed with pt. And husband.  Both state understanding of same.  No home equipment needed.  Ambulated to car with staff without incident.  D/c'd home with husband.

## 2011-07-20 NOTE — Discharge Summary (Signed)
Physician Discharge Summary  Patient ID: Christina Howard MRN: [id number] DOB/AGE: [birth year] 46 y.o.  Admit date: 07/19/2011 Discharge date: 07/20/2011  Admission Diagnoses: Menorrhagia Dysmenorrhea Rectocele  Discharge Diagnoses:  Menorrhagia Dysmenorrhea Rectocele Endometriosis S/p LAVH and posterior repair  Discharged Condition: good  Hospital Course: Pt admitted for post-op care s/p LAVH and posterior repair.  Pt did well and by post-op day #1 she was tolerating her diet and ambulating well.  Pain was well-controlled with vicodin and motrin. She had her packing removing and catheter removed and will be d/c home when able to void. BS were 130-180 and stable post-op on her accuchecks.    Discharge Exam: Blood pressure 129/84, pulse 72, temperature 98.2 F (36.8 C), temperature source Oral, resp. rate 18, height 5\' 4"  (1.626 m), weight 85.276 kg (188 lb), last menstrual period 07/07/2011, SpO2 97.00%. General appearance: alert Abd soft NT Incisions clear   Disposition: Home or Self Care   Dischartge Meds Vicodin Motrin Metformin Toprol HCTZ  F/u in 2 weeks for incision check in office   Signed: Oliver Pila 07/20/2011, 9:03 AM

## 2011-07-21 ENCOUNTER — Encounter (HOSPITAL_COMMUNITY): Payer: Self-pay | Admitting: Obstetrics and Gynecology

## 2011-09-21 ENCOUNTER — Other Ambulatory Visit: Payer: Self-pay | Admitting: Family Medicine

## 2011-09-21 DIAGNOSIS — K769 Liver disease, unspecified: Secondary | ICD-10-CM

## 2011-10-04 ENCOUNTER — Ambulatory Visit
Admission: RE | Admit: 2011-10-04 | Discharge: 2011-10-04 | Disposition: A | Discharge status: Home / Self Care | Payer: PRIVATE HEALTH INSURANCE | Source: Ambulatory Visit | Attending: Family Medicine | Admitting: Family Medicine

## 2011-10-04 DIAGNOSIS — K769 Liver disease, unspecified: Secondary | ICD-10-CM

## 2012-04-18 ENCOUNTER — Other Ambulatory Visit: Payer: Self-pay | Admitting: Family Medicine

## 2012-04-18 DIAGNOSIS — R9389 Abnormal findings on diagnostic imaging of other specified body structures: Secondary | ICD-10-CM

## 2012-04-24 ENCOUNTER — Ambulatory Visit
Admission: RE | Admit: 2012-04-24 | Discharge: 2012-04-24 | Disposition: A | Discharge status: Home / Self Care | Payer: PRIVATE HEALTH INSURANCE | Source: Ambulatory Visit | Attending: Family Medicine | Admitting: Family Medicine

## 2012-04-24 DIAGNOSIS — R9389 Abnormal findings on diagnostic imaging of other specified body structures: Secondary | ICD-10-CM

## 2013-10-15 DIAGNOSIS — M79609 Pain in unspecified limb: Secondary | ICD-10-CM | POA: Insufficient documentation

## 2013-10-16 ENCOUNTER — Ambulatory Visit
Admission: RE | Admit: 2013-10-16 | Discharge: 2013-10-16 | Disposition: A | Discharge status: Home / Self Care | Payer: PRIVATE HEALTH INSURANCE | Source: Ambulatory Visit | Attending: Family Medicine | Admitting: Family Medicine

## 2013-10-16 ENCOUNTER — Other Ambulatory Visit: Payer: Self-pay | Admitting: Family Medicine

## 2013-10-16 DIAGNOSIS — N6002 Solitary cyst of left breast: Secondary | ICD-10-CM

## 2013-11-06 ENCOUNTER — Encounter (INDEPENDENT_AMBULATORY_CARE_PROVIDER_SITE_OTHER): Payer: Self-pay

## 2013-11-06 ENCOUNTER — Ambulatory Visit (INDEPENDENT_AMBULATORY_CARE_PROVIDER_SITE_OTHER): Payer: PRIVATE HEALTH INSURANCE | Admitting: Surgery

## 2013-11-06 ENCOUNTER — Encounter (INDEPENDENT_AMBULATORY_CARE_PROVIDER_SITE_OTHER): Payer: Self-pay | Admitting: Surgery

## 2013-11-06 VITALS — BP 124/82 | HR 77 | Temp 97.2°F | Ht 64.0 in | Wt 178.8 lb

## 2013-11-06 DIAGNOSIS — N6089 Other benign mammary dysplasias of unspecified breast: Secondary | ICD-10-CM | POA: Insufficient documentation

## 2013-11-06 NOTE — Progress Notes (Signed)
Patient ID: Christina Howard, female   DOB: 1965/12/13, 48 y.o.   MRN: [id number]  Chief Complaint  Patient presents with  . Cyst    left breast    HPI Christina Howard is a 48 y.o. female.  Patient sent at the request of Emeterio ReeveSharon A Wolters, MD  For  left breast cyst. She has had a cyst along her lower breast for many years. It got larger and then burst last week. She has been on antibiotics. It is tender. It is draining a thick white material. No fever or chills. Ultrasound of the breast showed this to be an epidermal inclusion cyst. HPI   Past Medical History  Diagnosis Date  . Diabetes mellitus   . Hyperlipidemia   . Hypertension   . Obesity (BMI 30-39.9)   . Gallstones   . Arthritis   . Anemia   . Kidney stone   . Complication of anesthesia     pt states she had hot flashes for a week after surgery  . Menorrhagia 07/20/2011    Past Surgical History  Procedure Laterality Date  . Knee scopes  2003    total  . Nasal sinus surgery  2002  . Barthalmus glands removed  1989  . Endometrial ablation    . Cholecystectomy  05/31/2011    Procedure: LAPAROSCOPIC CHOLECYSTECTOMY WITH INTRAOPERATIVE CHOLANGIOGRAM;  Surgeon: Atilano InaEric M Wilson, MD;  Location: [facility];  Service: General;  Laterality: N/A;  . Laparoscopic assisted vaginal hysterectomy  07/19/2011    Procedure: LAPAROSCOPIC ASSISTED VAGINAL HYSTERECTOMY;  Surgeon: Oliver PilaKathy W Richardson;  Location: [facility];  Service: Gynecology;  Laterality: N/A;  . Rectocele repair  07/19/2011    Procedure: POSTERIOR REPAIR (RECTOCELE);  Surgeon: Oliver PilaKathy W Richardson;  Location: [facility];  Service: Gynecology;  Laterality: N/A;    Family History  Problem Relation Age of Onset  . Hypertension Mother   . Hyperlipidemia Mother   . Diabetes Father   . Hyperlipidemia Father   . Hypertension Father     Social History History  Substance Use Topics  . Smoking status: Never Smoker   . Smokeless tobacco: Not on file  . Alcohol Use: Yes     Comment: very  rarely    Allergies  Allergen Reactions  . Amoxicillin   . Oxycodone Hcl Itching    Makes pt itch after 2 days.  . Penicillins     Yeast in mouth  . Sulfa Antibiotics   . Sulfa Drugs Cross Reactors     Headache     Current Outpatient Prescriptions  Medication Sig Dispense Refill  . atorvastatin (LIPITOR) 10 MG tablet Take 10 mg by mouth at bedtime.       . Cholecalciferol (VITAMIN D3) 2000 UNITS capsule Take 2,000 Units by mouth every morning.        . Cholecalciferol (VITAMIN D3) 2000 UNITS capsule 1 capsule      . diphenhydrAMINE (BENADRYL) 25 mg capsule Take 50 mg by mouth at bedtime.       . hydrochlorothiazide (HYDRODIURIL) 25 MG tablet Take 25 mg by mouth every morning.       . metFORMIN (GLUCOPHAGE) 1000 MG tablet Take 1,000 mg by mouth 2 (two) times daily with a meal.        . metoprolol (TOPROL-XL) 50 MG 24 hr tablet Take 50 mg by mouth at bedtime.        No current facility-administered medications for this visit.    Review of Systems Review of  Systems  Constitutional: Negative for fever, chills and unexpected weight change.  HENT: Negative for congestion, hearing loss, sore throat, trouble swallowing and voice change.   Eyes: Negative for visual disturbance.  Respiratory: Negative for cough and wheezing.   Cardiovascular: Negative for chest pain, palpitations and leg swelling.  Gastrointestinal: Negative for nausea, vomiting, abdominal pain, diarrhea, constipation, blood in stool, abdominal distention and anal bleeding.  Genitourinary: Negative for hematuria, vaginal bleeding and difficulty urinating.  Musculoskeletal: Negative for arthralgias.  Skin: Negative for rash and wound.  Neurological: Negative for seizures, syncope and headaches.  Hematological: Negative for adenopathy. Does not bruise/bleed easily.  Psychiatric/Behavioral: Negative for confusion.    Blood pressure 124/82, pulse 77, temperature 97.2 F (36.2 C), height 5\' 4"  (1.626 m), weight 178 lb  12.8 oz (81.103 kg).  Physical Exam Physical Exam  Constitutional: She is oriented to person, place, and time. She appears well-developed and well-nourished.  HENT:  Head: Normocephalic and atraumatic.  Eyes: EOM are normal. Pupils are equal, round, and reactive to light.  Neck: Normal range of motion. Neck supple.  Pulmonary/Chest:    Musculoskeletal: Normal range of motion.  Neurological: She is alert and oriented to person, place, and time.  Skin: Skin is warm and dry.  Psychiatric: She has a normal mood and affect. Her behavior is normal. Judgment and thought content normal.    Data [facility]  Assessment    Ruptured epidermal inclusion cyst left breast    Plan    It appears well-drained. She has completed a course of antibiotics. Recommend wide excision as outpatient.The procedure has been discussed with the patient.  Alternative therapies have been discussed with the patient.  Operative risks include bleeding,  Infection,  Organ injury,  Nerve injury,  Blood vessel injury,  DVT,  Pulmonary embolism,  Death,  And possible reoperation.  Medical management risks include worsening of present situation.  The success of the procedure is 50 -90 % at treating patients symptoms.  The patient understands and agrees to proceed.       Dace Denn A. Wyvonne Carda 11/06/2013, 12:32 PM

## 2013-11-06 NOTE — Patient Instructions (Signed)

## 2013-11-14 ENCOUNTER — Encounter (HOSPITAL_BASED_OUTPATIENT_CLINIC_OR_DEPARTMENT_OTHER): Payer: Self-pay | Admitting: *Deleted

## 2013-11-14 NOTE — Progress Notes (Signed)
To come in for [facility] labs and ekg-

## 2013-11-15 ENCOUNTER — Encounter (HOSPITAL_BASED_OUTPATIENT_CLINIC_OR_DEPARTMENT_OTHER)
Admission: RE | Admit: 2013-11-15 | Discharge: 2013-11-15 | Disposition: A | Discharge status: Home / Self Care | Payer: PRIVATE HEALTH INSURANCE | Source: Ambulatory Visit | Attending: Surgery | Admitting: Surgery

## 2013-11-15 DIAGNOSIS — Z01810 Encounter for preprocedural cardiovascular examination: Secondary | ICD-10-CM | POA: Insufficient documentation

## 2013-11-15 DIAGNOSIS — Z01812 Encounter for preprocedural laboratory examination: Secondary | ICD-10-CM | POA: Insufficient documentation

## 2013-11-15 LAB — COMPREHENSIVE METABOLIC PANEL
ALK PHOS: 111 U/L (ref 39–117)
ALT: 29 U/L (ref 0–35)
AST: 22 U/L (ref 0–37)
Albumin: 4.3 g/dL (ref 3.5–5.2)
BILIRUBIN TOTAL: 0.3 mg/dL (ref 0.3–1.2)
BUN: 14 mg/dL (ref 6–23)
CO2: 23 mEq/L (ref 19–32)
Calcium: 10.3 mg/dL (ref 8.4–10.5)
Chloride: 93 mEq/L — ABNORMAL LOW (ref 96–112)
Creatinine, Ser: 0.56 mg/dL (ref 0.50–1.10)
GFR calc Af Amer: >90 mL/min (ref >90)
GFR calc non Af Amer: >90 mL/min (ref >90)
Glucose, Bld: 148 mg/dL — ABNORMAL HIGH (ref 70–99)
POTASSIUM: 4 meq/L (ref 3.7–5.3)
Sodium: 135 mEq/L — ABNORMAL LOW (ref 137–147)
TOTAL PROTEIN: 8.4 g/dL — AB (ref 6.0–8.3)

## 2013-11-15 LAB — CBC WITH DIFFERENTIAL/PLATELET
BASOS ABS: 0 10*3/uL (ref 0.0–0.1)
Basophils Relative: 0 % (ref 0–1)
Eosinophils Absolute: 0.6 10*3/uL (ref 0.0–0.7)
Eosinophils Relative: 8 % — ABNORMAL HIGH (ref 0–5)
HEMATOCRIT: 37.9 % (ref 36.0–46.0)
Hemoglobin: 12.8 g/dL (ref 12.0–15.0)
Lymphocytes Relative: 29 % (ref 12–46)
Lymphs Abs: 2.2 10*3/uL (ref 0.7–4.0)
MCH: 29.5 pg (ref 26.0–34.0)
MCHC: 33.8 g/dL (ref 30.0–36.0)
MCV: 87.3 fL (ref 78.0–100.0)
Monocytes Absolute: 0.7 10*3/uL (ref 0.1–1.0)
Monocytes Relative: 9 % (ref 3–12)
NEUTROS ABS: 4.1 10*3/uL (ref 1.7–7.7)
Neutrophils Relative %: 54 % (ref 43–77)
PLATELETS: 300 10*3/uL (ref 150–400)
RBC: 4.34 MIL/uL (ref 3.87–5.11)
RDW: 14.1 % (ref 11.5–15.5)
WBC: 7.5 10*3/uL (ref 4.0–10.5)

## 2013-11-20 ENCOUNTER — Encounter (HOSPITAL_BASED_OUTPATIENT_CLINIC_OR_DEPARTMENT_OTHER)
Admission: RE | Disposition: A | Discharge status: Home / Self Care | Payer: Self-pay | Source: Ambulatory Visit | Attending: Surgery

## 2013-11-20 ENCOUNTER — Encounter (HOSPITAL_BASED_OUTPATIENT_CLINIC_OR_DEPARTMENT_OTHER): Payer: PRIVATE HEALTH INSURANCE | Admitting: Anesthesiology

## 2013-11-20 ENCOUNTER — Ambulatory Visit (HOSPITAL_BASED_OUTPATIENT_CLINIC_OR_DEPARTMENT_OTHER)
Admission: RE | Admit: 2013-11-20 | Discharge: 2013-11-20 | Disposition: A | Discharge status: Home / Self Care | Payer: PRIVATE HEALTH INSURANCE | Source: Ambulatory Visit | Attending: Surgery | Admitting: Surgery

## 2013-11-20 ENCOUNTER — Ambulatory Visit (HOSPITAL_BASED_OUTPATIENT_CLINIC_OR_DEPARTMENT_OTHER): Payer: PRIVATE HEALTH INSURANCE | Admitting: Anesthesiology

## 2013-11-20 ENCOUNTER — Encounter (HOSPITAL_BASED_OUTPATIENT_CLINIC_OR_DEPARTMENT_OTHER): Payer: Self-pay

## 2013-11-20 DIAGNOSIS — D649 Anemia, unspecified: Secondary | ICD-10-CM | POA: Insufficient documentation

## 2013-11-20 DIAGNOSIS — Z882 Allergy status to sulfonamides status: Secondary | ICD-10-CM | POA: Insufficient documentation

## 2013-11-20 DIAGNOSIS — Z6831 Body mass index (BMI) 31.0-31.9, adult: Secondary | ICD-10-CM | POA: Insufficient documentation

## 2013-11-20 DIAGNOSIS — E119 Type 2 diabetes mellitus without complications: Secondary | ICD-10-CM | POA: Insufficient documentation

## 2013-11-20 DIAGNOSIS — E785 Hyperlipidemia, unspecified: Secondary | ICD-10-CM | POA: Insufficient documentation

## 2013-11-20 DIAGNOSIS — Z79899 Other long term (current) drug therapy: Secondary | ICD-10-CM | POA: Insufficient documentation

## 2013-11-20 DIAGNOSIS — E669 Obesity, unspecified: Secondary | ICD-10-CM | POA: Insufficient documentation

## 2013-11-20 DIAGNOSIS — L723 Sebaceous cyst: Secondary | ICD-10-CM

## 2013-11-20 DIAGNOSIS — N6009 Solitary cyst of unspecified breast: Secondary | ICD-10-CM | POA: Insufficient documentation

## 2013-11-20 DIAGNOSIS — I10 Essential (primary) hypertension: Secondary | ICD-10-CM | POA: Insufficient documentation

## 2013-11-20 DIAGNOSIS — Z880 Allergy status to penicillin: Secondary | ICD-10-CM | POA: Insufficient documentation

## 2013-11-20 DIAGNOSIS — M129 Arthropathy, unspecified: Secondary | ICD-10-CM | POA: Insufficient documentation

## 2013-11-20 DIAGNOSIS — Z885 Allergy status to narcotic agent status: Secondary | ICD-10-CM | POA: Insufficient documentation

## 2013-11-20 HISTORY — PX: BREAST CYST EXCISION: SHX579

## 2013-11-20 HISTORY — DX: Encounter for fitting and adjustment of spectacles and contact lenses: Z46.0

## 2013-11-20 LAB — POCT HEMOGLOBIN-HEMACUE: Hemoglobin: 11 g/dL — ABNORMAL LOW (ref 12.0–15.0)

## 2013-11-20 LAB — GLUCOSE, CAPILLARY
GLUCOSE-CAPILLARY: 139 mg/dL — AB (ref 70–99)
Glucose-Capillary: 183 mg/dL — ABNORMAL HIGH (ref 70–99)

## 2013-11-20 SURGERY — EXCISION, CYST, BREAST
Anesthesia: General | Site: Breast | Laterality: Left

## 2013-11-20 MED ORDER — CHLORHEXIDINE GLUCONATE 4 % EX LIQD: 1.0000 "application " | Freq: Once | CUTANEOUS | Status: DC

## 2013-11-20 MED ORDER — ONDANSETRON HCL 4 MG/2ML IJ SOLN: 4.0000 mg | Freq: Once | INTRAMUSCULAR | Status: DC | PRN

## 2013-11-20 MED ORDER — MIDAZOLAM HCL 2 MG/2ML IJ SOLN
INTRAMUSCULAR | Status: AC
  Filled 2013-11-20: qty 2

## 2013-11-20 MED ORDER — ONDANSETRON HCL 4 MG/2ML IJ SOLN
INTRAMUSCULAR | Status: DC | PRN
  Administered 2013-11-20: 4 mg via INTRAVENOUS

## 2013-11-20 MED ORDER — 0.9 % SODIUM CHLORIDE (POUR BTL) OPTIME
TOPICAL | Status: DC | PRN
  Administered 2013-11-20: 100 mL

## 2013-11-20 MED ORDER — FENTANYL CITRATE 0.05 MG/ML IJ SOLN
INTRAMUSCULAR | Status: AC
  Filled 2013-11-20: qty 6

## 2013-11-20 MED ORDER — MIDAZOLAM HCL 2 MG/2ML IJ SOLN: 1.0000 mg | INTRAMUSCULAR | Status: DC | PRN

## 2013-11-20 MED ORDER — DEXAMETHASONE SODIUM PHOSPHATE 4 MG/ML IJ SOLN
INTRAMUSCULAR | Status: DC | PRN
  Administered 2013-11-20: 8 mg via INTRAVENOUS

## 2013-11-20 MED ORDER — TRAMADOL HCL 50 MG PO TABS: 50.0000 mg | ORAL_TABLET | Freq: Four times a day (QID) | ORAL | Status: DC | PRN

## 2013-11-20 MED ORDER — FENTANYL CITRATE 0.05 MG/ML IJ SOLN
INTRAMUSCULAR | Status: DC | PRN
  Administered 2013-11-20: 100 ug via INTRAVENOUS

## 2013-11-20 MED ORDER — CIPROFLOXACIN IN D5W 400 MG/200ML IV SOLN
400.0000 mg | INTRAVENOUS | Status: AC
  Administered 2013-11-20: 400 mg via INTRAVENOUS

## 2013-11-20 MED ORDER — PROPOFOL 10 MG/ML IV BOLUS
INTRAVENOUS | Status: AC
  Filled 2013-11-20: qty 20

## 2013-11-20 MED ORDER — CIPROFLOXACIN IN D5W 400 MG/200ML IV SOLN
INTRAVENOUS | Status: AC
  Filled 2013-11-20: qty 200

## 2013-11-20 MED ORDER — BUPIVACAINE-EPINEPHRINE 0.25% -1:200000 IJ SOLN
INTRAMUSCULAR | Status: DC | PRN
  Administered 2013-11-20: 10 mL

## 2013-11-20 MED ORDER — OXYCODONE HCL 5 MG PO TABS: 5.0000 mg | ORAL_TABLET | Freq: Once | ORAL | Status: DC | PRN

## 2013-11-20 MED ORDER — MIDAZOLAM HCL 5 MG/5ML IJ SOLN
INTRAMUSCULAR | Status: DC | PRN
  Administered 2013-11-20: 2 mg via INTRAVENOUS

## 2013-11-20 MED ORDER — FENTANYL CITRATE 0.05 MG/ML IJ SOLN: 50.0000 ug | INTRAMUSCULAR | Status: DC | PRN

## 2013-11-20 MED ORDER — LIDOCAINE HCL (CARDIAC) 20 MG/ML IV SOLN
INTRAVENOUS | Status: DC | PRN
  Administered 2013-11-20: 50 mg via INTRAVENOUS

## 2013-11-20 MED ORDER — HYDROMORPHONE HCL PF 1 MG/ML IJ SOLN
INTRAMUSCULAR | Status: AC
  Filled 2013-11-20: qty 1

## 2013-11-20 MED ORDER — BUPIVACAINE-EPINEPHRINE (PF) 0.25% -1:200000 IJ SOLN
INTRAMUSCULAR | Status: AC
  Filled 2013-11-20: qty 30

## 2013-11-20 MED ORDER — OXYCODONE HCL 5 MG/5ML PO SOLN: 5.0000 mg | Freq: Once | ORAL | Status: DC | PRN

## 2013-11-20 MED ORDER — HYDROMORPHONE HCL PF 1 MG/ML IJ SOLN
0.2500 mg | INTRAMUSCULAR | Status: DC | PRN
  Administered 2013-11-20 (×2): 0.5 mg via INTRAVENOUS

## 2013-11-20 MED ORDER — DOXYCYCLINE HYCLATE 50 MG PO CAPS: 100.0000 mg | ORAL_CAPSULE | Freq: Two times a day (BID) | ORAL | Status: DC

## 2013-11-20 MED ORDER — LACTATED RINGERS IV SOLN
INTRAVENOUS | Status: DC
  Administered 2013-11-20: 07:00:00 via INTRAVENOUS

## 2013-11-20 MED ORDER — PROPOFOL 10 MG/ML IV BOLUS
INTRAVENOUS | Status: DC | PRN
  Administered 2013-11-20: 200 mg via INTRAVENOUS

## 2013-11-20 MED ORDER — BUPIVACAINE-EPINEPHRINE (PF) 0.5% -1:200000 IJ SOLN
INTRAMUSCULAR | Status: AC
Start: 2013-11-20 — End: 2013-11-20
  Filled 2013-11-20: qty 30

## 2013-11-20 SURGICAL SUPPLY — 52 items
ADH SKN CLS APL DERMABOND .7 (GAUZE/BANDAGES/DRESSINGS) ×1
APPLIER CLIP 9.375 MED OPEN (MISCELLANEOUS)
APR CLP MED 9.3 20 MLT OPN (MISCELLANEOUS)
BINDER BREAST LRG (GAUZE/BANDAGES/DRESSINGS) IMPLANT
BINDER BREAST MEDIUM (GAUZE/BANDAGES/DRESSINGS) IMPLANT
BINDER BREAST XLRG (GAUZE/BANDAGES/DRESSINGS) IMPLANT
BINDER BREAST XXLRG (GAUZE/BANDAGES/DRESSINGS) IMPLANT
BLADE 15 SAFETY STRL DISP (BLADE) ×3 IMPLANT
CANISTER SUCT 1200ML W/VALVE (MISCELLANEOUS) ×3 IMPLANT
CHLORAPREP W/TINT 26ML (MISCELLANEOUS) ×1 IMPLANT
CLIP APPLIE 9.375 MED OPEN (MISCELLANEOUS) IMPLANT
CLIP TI WIDE RED SMALL 6 (CLIP) IMPLANT
COVER MAYO STAND STRL (DRAPES) ×3 IMPLANT
COVER TABLE BACK 60X90 (DRAPES) ×3 IMPLANT
DECANTER SPIKE VIAL GLASS SM (MISCELLANEOUS) ×1 IMPLANT
DERMABOND ADVANCED (GAUZE/BANDAGES/DRESSINGS) ×2
DERMABOND ADVANCED .7 DNX12 (GAUZE/BANDAGES/DRESSINGS) ×1 IMPLANT
DEVICE DUBIN W/COMP PLATE 8390 (MISCELLANEOUS) IMPLANT
DRAPE LAPAROSCOPIC ABDOMINAL (DRAPES) IMPLANT
DRAPE PED LAPAROTOMY (DRAPES) ×3 IMPLANT
DRAPE UTILITY XL STRL (DRAPES) ×3 IMPLANT
ELECT COATED BLADE 2.86 ST (ELECTRODE) ×3 IMPLANT
ELECT REM PT RETURN 9FT ADLT (ELECTROSURGICAL) ×3
ELECTRODE REM PT RTRN 9FT ADLT (ELECTROSURGICAL) ×1 IMPLANT
GLOVE BIOGEL PI IND STRL 7.0 (GLOVE) IMPLANT
GLOVE BIOGEL PI IND STRL 8 (GLOVE) ×1 IMPLANT
GLOVE BIOGEL PI INDICATOR 7.0 (GLOVE) ×2
GLOVE BIOGEL PI INDICATOR 8 (GLOVE) ×2
GLOVE ECLIPSE 7.5 STRL STRAW (GLOVE) ×2 IMPLANT
GLOVE ECLIPSE 8.0 STRL XLNG CF (GLOVE) ×3 IMPLANT
GOWN STRL REUS W/ TWL LRG LVL3 (GOWN DISPOSABLE) ×2 IMPLANT
GOWN STRL REUS W/TWL LRG LVL3 (GOWN DISPOSABLE) ×6
KIT MARKER MARGIN INK (KITS) IMPLANT
NDL HYPO 25X1 1.5 SAFETY (NEEDLE) ×1 IMPLANT
NEEDLE HYPO 25X1 1.5 SAFETY (NEEDLE) ×3 IMPLANT
NS IRRIG 1000ML POUR BTL (IV SOLUTION) ×3 IMPLANT
PACK BASIN DAY SURGERY FS (CUSTOM PROCEDURE TRAY) ×3 IMPLANT
PENCIL BUTTON HOLSTER BLD 10FT (ELECTRODE) ×3 IMPLANT
SLEEVE SCD COMPRESS KNEE MED (MISCELLANEOUS) ×3 IMPLANT
SPONGE LAP 4X18 X RAY DECT (DISPOSABLE) ×3 IMPLANT
STAPLER VISISTAT 35W (STAPLE) IMPLANT
SUT MON AB 4-0 PC3 18 (SUTURE) ×3 IMPLANT
SUT SILK 2 0 SH (SUTURE) IMPLANT
SUT VIC AB 0 SH 27 (SUTURE) ×2 IMPLANT
SUT VIC AB 3-0 SH 27 (SUTURE)
SUT VIC AB 3-0 SH 27X BRD (SUTURE) ×1 IMPLANT
SYR CONTROL 10ML LL (SYRINGE) ×3 IMPLANT
TOWEL OR 17X24 6PK STRL BLUE (TOWEL DISPOSABLE) ×6 IMPLANT
TOWEL OR NON WOVEN STRL DISP B (DISPOSABLE) ×3 IMPLANT
TUBE CONNECTING 20'X1/4 (TUBING) ×1
TUBE CONNECTING 20X1/4 (TUBING) ×2 IMPLANT
YANKAUER SUCT BULB TIP NO VENT (SUCTIONS) ×3 IMPLANT

## 2013-11-20 NOTE — Interval H&P Note (Signed)
History and Physical Interval Note:  11/20/2013 7:11 AM  Christina KindsLisa A Howard  has presented today for surgery, with the diagnosis of sebacous cyst   The various methods of treatment have been discussed with the patient and family. After consideration of risks, benefits and other options for treatment, the patient has consented to  Procedure(s): CYST EXCISION BREAST (Left) as a surgical intervention .  [name] history has been reviewed, patient examined, no change in status, stable for surgery.  I have reviewed the patient's chart and labs.  Questions were answered to the patient's satisfaction.     Raneen Jaffer A. Keiyana Stehr

## 2013-11-20 NOTE — Transfer of Care (Signed)
Immediate Anesthesia Transfer of Care Note  Patient: Bess KindsLisa A Slivka  Procedure(s) Performed: Procedure(s): CYST EXCISION BREAST (Left)  Patient Location: PACU  Anesthesia Type:General  Level of Consciousness: awake, alert  and oriented  Airway & Oxygen Therapy: Patient Spontanous Breathing and Patient connected to face mask oxygen  Post-op Assessment: Report given to PACU RN and Post -op Vital signs reviewed and stable  Post vital signs: Reviewed and stable  Complications: No apparent anesthesia complications

## 2013-11-20 NOTE — H&P (View-Only) (Signed)
Patient ID: Christina Howard, female   DOB: 1966/03/22, 48 y.o.   MRN: [id number]  Chief Complaint  Patient presents with  . Cyst    left breast    HPI Christina Howard is a 48 y.o. female.  Patient sent at the request of Emeterio ReeveSharon A Wolters, MD  For  left breast cyst. She has had a cyst along her lower breast for many years. It got larger and then burst last week. She has been on antibiotics. It is tender. It is draining a thick white material. No fever or chills. Ultrasound of the breast showed this to be an epidermal inclusion cyst. HPI   Past Medical History  Diagnosis Date  . Diabetes mellitus   . Hyperlipidemia   . Hypertension   . Obesity (BMI 30-39.9)   . Gallstones   . Arthritis   . Anemia   . Kidney stone   . Complication of anesthesia     pt states she had hot flashes for a week after surgery  . Menorrhagia 07/20/2011    Past Surgical History  Procedure Laterality Date  . Knee scopes  2003    total  . Nasal sinus surgery  2002  . Barthalmus glands removed  1989  . Endometrial ablation    . Cholecystectomy  05/31/2011    Procedure: LAPAROSCOPIC CHOLECYSTECTOMY WITH INTRAOPERATIVE CHOLANGIOGRAM;  Surgeon: Atilano InaEric M Wilson, MD;  Location: [facility];  Service: General;  Laterality: N/A;  . Laparoscopic assisted vaginal hysterectomy  07/19/2011    Procedure: LAPAROSCOPIC ASSISTED VAGINAL HYSTERECTOMY;  Surgeon: Oliver PilaKathy W Richardson;  Location: [facility];  Service: Gynecology;  Laterality: N/A;  . Rectocele repair  07/19/2011    Procedure: POSTERIOR REPAIR (RECTOCELE);  Surgeon: Oliver PilaKathy W Richardson;  Location: [facility];  Service: Gynecology;  Laterality: N/A;    Family History  Problem Relation Age of Onset  . Hypertension Mother   . Hyperlipidemia Mother   . Diabetes Father   . Hyperlipidemia Father   . Hypertension Father     Social History History  Substance Use Topics  . Smoking status: Never Smoker   . Smokeless tobacco: Not on file  . Alcohol Use: Yes     Comment: very  rarely    Allergies  Allergen Reactions  . Amoxicillin   . Oxycodone Hcl Itching    Makes pt itch after 2 days.  . Penicillins     Yeast in mouth  . Sulfa Antibiotics   . Sulfa Drugs Cross Reactors     Headache     Current Outpatient Prescriptions  Medication Sig Dispense Refill  . atorvastatin (LIPITOR) 10 MG tablet Take 10 mg by mouth at bedtime.       . Cholecalciferol (VITAMIN D3) 2000 UNITS capsule Take 2,000 Units by mouth every morning.        . Cholecalciferol (VITAMIN D3) 2000 UNITS capsule 1 capsule      . diphenhydrAMINE (BENADRYL) 25 mg capsule Take 50 mg by mouth at bedtime.       . hydrochlorothiazide (HYDRODIURIL) 25 MG tablet Take 25 mg by mouth every morning.       . metFORMIN (GLUCOPHAGE) 1000 MG tablet Take 1,000 mg by mouth 2 (two) times daily with a meal.        . metoprolol (TOPROL-XL) 50 MG 24 hr tablet Take 50 mg by mouth at bedtime.        No current facility-administered medications for this visit.    Review of Systems Review of  Systems  Constitutional: Negative for fever, chills and unexpected weight change.  HENT: Negative for congestion, hearing loss, sore throat, trouble swallowing and voice change.   Eyes: Negative for visual disturbance.  Respiratory: Negative for cough and wheezing.   Cardiovascular: Negative for chest pain, palpitations and leg swelling.  Gastrointestinal: Negative for nausea, vomiting, abdominal pain, diarrhea, constipation, blood in stool, abdominal distention and anal bleeding.  Genitourinary: Negative for hematuria, vaginal bleeding and difficulty urinating.  Musculoskeletal: Negative for arthralgias.  Skin: Negative for rash and wound.  Neurological: Negative for seizures, syncope and headaches.  Hematological: Negative for adenopathy. Does not bruise/bleed easily.  Psychiatric/Behavioral: Negative for confusion.    Blood pressure 124/82, pulse 77, temperature 97.2 F (36.2 C), height 5\' 4"  (1.626 m), weight 178 lb  12.8 oz (81.103 kg).  Physical Exam Physical Exam  Constitutional: She is oriented to person, place, and time. She appears well-developed and well-nourished.  HENT:  Head: Normocephalic and atraumatic.  Eyes: EOM are normal. Pupils are equal, round, and reactive to light.  Neck: Normal range of motion. Neck supple.  Pulmonary/Chest:    Musculoskeletal: Normal range of motion.  Neurological: She is alert and oriented to person, place, and time.  Skin: Skin is warm and dry.  Psychiatric: She has a normal mood and affect. Her behavior is normal. Judgment and thought content normal.    Data Reviewed [facility]  Assessment    Ruptured epidermal inclusion cyst left breast    Plan    It appears well-drained. She has completed a course of antibiotics. Recommend wide excision as outpatient.The procedure has been discussed with the patient.  Alternative therapies have been discussed with the patient.  Operative risks include bleeding,  Infection,  Organ injury,  Nerve injury,  Blood vessel injury,  DVT,  Pulmonary embolism,  Death,  And possible reoperation.  Medical management risks include worsening of present situation.  The success of the procedure is 50 -90 % at treating patients symptoms.  The patient understands and agrees to proceed.       Mikella Linsley A. Sara Selvidge 11/06/2013, 12:32 PM

## 2013-11-20 NOTE — Anesthesia Postprocedure Evaluation (Signed)
  Anesthesia Post-op Note  Patient: Christina Howard  Procedure(s) Performed: Procedure(s): CYST EXCISION BREAST (Left)  Patient Location: PACU  Anesthesia Type:General  Level of Consciousness: awake, alert  and oriented  Airway and Oxygen Therapy: Patient Spontanous Breathing  Post-op Pain: none  Post-op Assessment: Post-op Vital signs reviewed  Post-op Vital Signs: Reviewed  Last Vitals:  Filed Vitals:   11/20/13 0845  BP: 141/77  Pulse: 80  Temp:   Resp: 13    Complications: No apparent anesthesia complications

## 2013-11-20 NOTE — Anesthesia Preprocedure Evaluation (Signed)
Anesthesia Evaluation  Patient identified by MRN, date of birth, ID band Patient awake    Reviewed: Allergy & Precautions, H&P , NPO status , Patient's Chart, lab work & pertinent test results  Airway Mallampati: I TM Distance: >3 FB Neck ROM: Full    Dental  (+) Teeth Intact, Dental Advisory Given   Pulmonary  breath sounds clear to auscultation        Cardiovascular hypertension, Pt. on medications Rhythm:Regular Rate:Normal     Neuro/Psych    GI/Hepatic   Endo/Other  diabetes, Well Controlled, Type 2, Oral Hypoglycemic Agents  Renal/GU      Musculoskeletal   Abdominal   Peds  Hematology   Anesthesia Other Findings   Reproductive/Obstetrics                           Anesthesia Physical Anesthesia Plan  ASA: III  Anesthesia Plan: General   Post-op Pain Management:    Induction: Intravenous  Airway Management Planned: LMA  Additional Equipment:   Intra-op Plan:   Post-operative Plan: Extubation in OR  Informed Consent: I have reviewed the patients History and Physical, chart, labs and discussed the procedure including the risks, benefits and alternatives for the proposed anesthesia with the patient or authorized representative who has indicated his/her understanding and acceptance.   Dental advisory given  Plan Discussed with: CRNA, Anesthesiologist and Surgeon  Anesthesia Plan Comments:         Anesthesia Quick Evaluation  

## 2013-11-20 NOTE — Discharge Instructions (Signed)
GENERAL SURGERY: POST OP INSTRUCTIONS ° °1. DIET: Follow a light bland diet the first 24 hours after arrival home, such as soup, liquids, crackers, etc.  Be sure to include lots of fluids daily.  Avoid fast food or heavy meals as your are more likely to get nauseated.   °2. Take your usually prescribed home medications unless otherwise directed. °3. PAIN CONTROL: °a. Pain is best controlled by a usual combination of three different methods TOGETHER: °i. Ice/Heat °ii. Over the counter pain medication °iii. Prescription pain medication °b. Most patients will experience some swelling and bruising around the incisions.  Ice packs or heating pads (30-60 minutes up to 6 times a day) will help. Use ice for the first few days to help decrease swelling and bruising, then switch to heat to help relax tight/sore spots and speed recovery.  Some people prefer to use ice alone, heat alone, alternating between ice & heat.  Experiment to what works for you.  Swelling and bruising can take several weeks to resolve.   °c. It is helpful to take an over-the-counter pain medication regularly for the first few weeks.  Choose one of the following that works best for you: °i. Naproxen (Aleve, etc)  Two 220mg tabs twice a day °ii. Ibuprofen (Advil, etc) Three 200mg tabs four times a day (every meal & bedtime) °iii. Acetaminophen (Tylenol, etc) 500-650mg four times a day (every meal & bedtime) °d. A  prescription for pain medication (such as oxycodone, hydrocodone, etc) should be given to you upon discharge.  Take your pain medication as prescribed.  °i. If you are having problems/concerns with the prescription medicine (does not control pain, nausea, vomiting, rash, itching, etc), please call us [phone] to see if we need to switch you to a different pain medicine that will work better for you and/or control your side effect better. °ii. If you need a refill on your pain medication, please contact your pharmacy.  They will contact our  office to request authorization. Prescriptions will not be filled after 5 pm or on week-ends. °4. Avoid getting constipated.  Between the surgery and the pain medications, it is common to experience some constipation.  Increasing fluid intake and taking a fiber supplement (such as Metamucil, Citrucel, FiberCon, MiraLax, etc) 1-2 times a day regularly will usually help prevent this problem from occurring.  A mild laxative (prune juice, Milk of Magnesia, MiraLax, etc) should be taken according to package directions if there are no bowel movements after 48 hours.   °5. Wash / shower every day.  You Stahly shower over the dressings as they are waterproof.  Continue to shower over incision(s) after the dressing is off. °6. Remove your waterproof bandages 5 days after surgery.  You Winer leave the incision open to air.  You Mcmann have skin tapes (Steri Strips) covering the incision(s).  Leave them on until one week, then remove.  You Karney replace a dressing/Band-Aid to cover the incision for comfort if you wish.  ° ° ° ° °7. ACTIVITIES as tolerated:   °a. You Stogner resume regular (light) daily activities beginning the next day--such as daily self-care, walking, climbing stairs--gradually increasing activities as tolerated.  If you can walk 30 minutes without difficulty, it is safe to try more intense activity such as jogging, treadmill, bicycling, low-impact aerobics, swimming, etc. °b. Save the most intensive and strenuous activity for last such as sit-ups, heavy lifting, contact sports, etc  Refrain from any heavy lifting or straining until you   are off narcotics for pain control.   c. DO NOT PUSH THROUGH PAIN.  Let pain be your guide: If it hurts to do something, don't do it.  Pain is your body warning you to avoid that activity for another week until the pain goes down. d. You may drive when you are no longer taking prescription pain medication, you can comfortably wear a seatbelt, and you can safely maneuver your car and  apply brakes. e. Bonita QuinYou may have sexual intercourse when it is comfortable.  8. FOLLOW UP in our office a. Please call [facility] at [phone] to set up an appointment to see your surgeon in the office for a follow-up appointment approximately 2-3 weeks after your surgery. b. Make sure that you call for this appointment the day you arrive home to insure a convenient appointment time. 9. IF YOU HAVE DISABILITY OR FAMILY LEAVE FORMS, BRING THEM TO THE OFFICE FOR PROCESSING.  DO NOT GIVE THEM TO YOUR DOCTOR.   WHEN TO CALL US [phone]: 1. Poor pain control 2. Reactions / problems with new medications (rash/itching, nausea, etc)  3. Fever over 101.5 F (38.5 C) 4. Worsening swelling or bruising 5. Continued bleeding from incision. 6. Increased pain, redness, or drainage from the incision 7. Difficulty breathing / swallowing   The clinic staff is available to answer your questions during regular business hours (8:30am-5pm).  Please dont hesitate to call and ask to speak to one of our nurses for clinical concerns.   If you have a medical emergency, go to the nearest emergency room or call 911.  [facility] is always on call at the [facility]   [facility], [place] [address], [address], [place], [place]  [id number] ? MAIN: [phone] ? TOLL FREE: [phone] ?  FAX [phone] [email or web address]    Post Anesthesia Home Care Instructions  Activity: Get plenty of rest for the remainder of the day. A responsible adult should stay with you for 24 hours following the procedure.  For the next 24 hours, DO NOT: -Drive a car -Advertising copywriterperate machinery -Drink alcoholic beverages -Take any medication unless instructed by your physician -Make any legal decisions or sign important papers.  Meals: Start with liquid foods such as gelatin or soup. Progress to regular foods as tolerated. Avoid greasy, spicy, heavy foods. If nausea and/or  vomiting occur, drink only clear liquids until the nausea and/or vomiting subsides. Call your physician if vomiting continues.  Special Instructions/Symptoms: Your throat may feel dry or sore from the anesthesia or the breathing tube placed in your throat during surgery. If this causes discomfort, gargle with warm salt water. The discomfort should disappear within 24 hours.   Call your surgeon if you experience:   1.  Fever over 101.0. 2.  Inability to urinate. 3.  Nausea and/or vomiting. 4.  Extreme swelling or bruising at the surgical site. 5.  Continued bleeding from the incision. 6.  Increased pain, redness or drainage from the incision. 7.  Problems related to your pain medication.

## 2013-11-20 NOTE — Anesthesia Procedure Notes (Signed)
Procedure Name: LMA Insertion Date/Time: 11/20/2013 7:34 AM Performed by: Zenia ResidesPAYNE, Tatisha Cerino D Pre-anesthesia Checklist: Patient identified, Emergency Drugs available, Suction available and Patient being monitored Patient Re-evaluated:Patient Re-evaluated prior to inductionOxygen Delivery Method: Circle System Utilized Preoxygenation: Pre-oxygenation with 100% oxygen Intubation Type: IV induction Ventilation: Mask ventilation without difficulty LMA: LMA inserted LMA Size: 4.0 Number of attempts: 1 Airway Equipment and Method: bite block Placement Confirmation: positive ETCO2 Tube secured with: Tape Dental Injury: Teeth and Oropharynx as per pre-operative assessment

## 2013-11-20 NOTE — Brief Op Note (Signed)
11/20/2013  8:15 AM  PATIENT:  Christina Howard  48 y.o. female  PRE-OPERATIVE DIAGNOSIS:  sebacous cyst   POST-OPERATIVE DIAGNOSIS:  sebacous cyst   PROCEDURE:  Procedure(s): CYST EXCISION BREAST (Left)  SURGEON:  Surgeon(s) and Role:    * Ahtziri Jeffries A. Darrelle Barrell, MD - Primary      ANESTHESIA:   general with 0.25 % marcaine with epinephrine  EBL:  Total I/O In: 700 [I.V.:700] Out: -       LOCAL MEDICATIONS USED:  MARCAINE     SPECIMEN:  Source of Specimen:  cyst left breast  DISPOSITION OF SPECIMEN:  PATHOLOGY  COUNTS:  YES  TOURNIQUET:  * No tourniquets in log *  DICTATION: .Other Dictation: Dictation Number [id number]  PLAN OF CARE: Discharge to home after PACU  PATIENT DISPOSITION:  PACU - hemodynamically stable.   Delay start of Pharmacological VTE agent (>24hrs) due to surgical blood loss or risk of bleeding: not applicable

## 2013-11-21 ENCOUNTER — Encounter (HOSPITAL_BASED_OUTPATIENT_CLINIC_OR_DEPARTMENT_OTHER): Payer: Self-pay | Admitting: Surgery

## 2013-11-21 NOTE — Op Note (Signed)
Christina Howard, Christina Howard               ACCOUNT NO.:  [id number]  MEDICAL RECORD NO.:  [id number]  LOCATION:                                 FACILITY:  PHYSICIAN:  Haward Pope A. Clara Smolen, M.D.     DATE OF BIRTH:  DATE OF PROCEDURE:  11/20/2013 DATE OF DISCHARGE:                              OPERATIVE REPORT   PREOPERATIVE DIAGNOSIS:  A 3 cm x 3 cm ruptured epidermal inclusion cyst, left breast inframammary fold.  POSTOPERATIVE DIAGNOSIS:  A 3 cm x 3 cm ruptured epidermal inclusion cyst, left breast inframammary fold.  PROCEDURE:  Excision of 3 cm x 3 cm ruptured epidermal inclusion cyst, left breast inframammary fold.  SURGEON:  Marcello Moores A. Erhardt Dada, M.D.  ANESTHESIA:  LMA with 0.25% Sensorcaine local with epinephrine.  ESTIMATED BLOOD LOSS:  Minimal.  SPECIMENS:  Cyst and contents to Pathology.  DRAINS:  None.  INDICATIONS FOR PROCEDURE:  The patient is a 48 year old female with a ruptured epidermal inclusion cyst along the inframammary fold of her left breast.  This had ruptured a number of weeks ago and she has been treated with antibiotics.  She is here today for definitive excision. Risks, benefits, and alternatives to surgery were discussed with her at great length today.  She wished to proceed.  Risk of bleeding, infection, recurrence, and the need for other operative interventions discussed.  DESCRIPTION OF PROCEDURE:  The patient was met in the holding area and left breast marked as correct side.  Questions were answered with her husband at the bedside.  She was taken back to the OR room 8 and placed supine on the OR table.  The left breast was prepped and draped in sterile fashion.  After induction of LMA anesthesia, time-out was done. She received preoperative antibiotics.  The cystic structure was identified along the medial portion of the inframammary fold. Curvilinear incision was made around it.  The entire cyst was excised. The subcu tissues were irrigated and closed  with deep layer of 3-0 Vicryl and a 4-0 Monocryl stitch.  Dermabond applied.  All final counts of sponge, needle, and instruments found to be correct.  Of note, this was not ruptured.  No signs of infection.  It measured 3 cm x 3 cm.  After allowing the Dermabond to dry, the patient was extubated, taken to recovery in satisfactory condition.  All final counts were found to be correct.     Samantha Olivera A. Brogan England, M.D.     TAC/MEDQ  D:  11/20/2013  T:  11/20/2013  Job:  [id number]

## 2013-12-07 ENCOUNTER — Ambulatory Visit (INDEPENDENT_AMBULATORY_CARE_PROVIDER_SITE_OTHER): Payer: PRIVATE HEALTH INSURANCE | Admitting: Surgery

## 2013-12-07 ENCOUNTER — Encounter (INDEPENDENT_AMBULATORY_CARE_PROVIDER_SITE_OTHER): Payer: Self-pay | Admitting: Surgery

## 2013-12-07 VITALS — BP 138/84 | HR 78 | Temp 97.8°F | Resp 12 | Ht 64.0 in | Wt 179.8 lb

## 2013-12-07 DIAGNOSIS — Z9889 Other specified postprocedural states: Secondary | ICD-10-CM

## 2013-12-07 NOTE — Patient Instructions (Signed)
Return as needed

## 2013-12-07 NOTE — Progress Notes (Signed)
Patient returns 2 weeks after excision left breast ruptured epidermal inclusion cyst. She's doing well.  Exam: Incision the left breast inferior mammary fold is clean dry and intact. No signs of infection.  Impression: Status post excision left breast ruptured epidermal inclusion cyst  Plan: Doing well. Resume full activity. Followup as needed.

## 2013-12-21 ENCOUNTER — Ambulatory Visit (INDEPENDENT_AMBULATORY_CARE_PROVIDER_SITE_OTHER): Payer: PRIVATE HEALTH INSURANCE

## 2013-12-21 VITALS — BP 138/73 | HR 69 | Resp 16 | Ht 64.0 in | Wt 179.0 lb

## 2013-12-21 DIAGNOSIS — M722 Plantar fascial fibromatosis: Secondary | ICD-10-CM

## 2013-12-21 DIAGNOSIS — E1341 Other specified diabetes mellitus with diabetic mononeuropathy: Secondary | ICD-10-CM

## 2013-12-21 DIAGNOSIS — E1349 Other specified diabetes mellitus with other diabetic neurological complication: Secondary | ICD-10-CM

## 2013-12-21 DIAGNOSIS — E1140 Type 2 diabetes mellitus with diabetic neuropathy, unspecified: Secondary | ICD-10-CM

## 2013-12-21 DIAGNOSIS — E1142 Type 2 diabetes mellitus with diabetic polyneuropathy: Secondary | ICD-10-CM

## 2013-12-21 DIAGNOSIS — E1149 Type 2 diabetes mellitus with other diabetic neurological complication: Secondary | ICD-10-CM

## 2013-12-21 MED ORDER — GABAPENTIN 300 MG PO CAPS: 300.0000 mg | ORAL_CAPSULE | Freq: Every day | ORAL | Status: DC

## 2013-12-21 NOTE — Progress Notes (Signed)
   Subjective:    Patient ID: Christina Howard, female    DOB: April 14, 1966, 48 y.o.   MRN: [id number]  HPI Comments: "I have these sensations in my feet my doctor wanted checked"  Patient c/o numbness, burning, tingling in whole foot bilaterally, left over right,  for about 8-10 months. She has seen her PCP and said to see a podiatrist before she is put on meds for neuropathy. Her last A1C was 6.5, but sugars lately have not been good. Very sensitive to the touch in the evenings.  Foot Pain Associated symptoms include numbness and a sore throat.      Review of Systems  HENT: Positive for sore throat.   Musculoskeletal: Positive for gait problem.  Neurological: Positive for numbness.  All other systems reviewed and are negative.      Objective:   Physical Exam Neurovascular status is intact pedal pulses are palpable epicritic and proprioceptive sensations intact although slightly diminished on Semmes Weinstein testing to the plantar forefoot intact sensations in most areas noted vibratory sensation intact is normal plantar response and DTRs bilateral. Patient does have some pain on palpation Magan plantar fascia and some slight paresthesia especially at bedtime teacher weeks or meds. No history of injury trauma patient wears a good stable shoe most times however does sometimes wear shoes with lack support . 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 for fascia history of if it progresses however this time we'll do a trial Neurontin  at 300 mg q. at bedtime followup in one to 2 months for readjustment medication list maintain good stable shoe at all  times  Alvan Dame DPM

## 2013-12-21 NOTE — Patient Instructions (Signed)
Diabetes and Foot Care Diabetes may cause you to have problems because of poor blood supply (circulation) to your feet and legs. This may cause the skin on your feet to become thinner, break easier, and heal more slowly. Your skin may become dry, and the skin may peel and crack. You may also have nerve damage in your legs and feet causing decreased feeling in them. You may not notice minor injuries to your feet that could lead to infections or more serious problems. Taking care of your feet is one of the most important things you can do for yourself.  HOME CARE INSTRUCTIONS  Wear shoes at all times, even in the house. Do not go barefoot. Bare feet are easily injured.  Check your feet daily for blisters, cuts, and redness. If you cannot see the bottom of your feet, use a mirror or ask someone for help.  Wash your feet with warm water (do not use hot water) and mild soap. Then pat your feet and the areas between your toes until they are completely dry. Do not soak your feet as this can dry your skin.  Apply a moisturizing lotion or petroleum jelly (that does not contain alcohol and is unscented) to the skin on your feet and to dry, brittle toenails. Do not apply lotion between your toes.  Trim your toenails straight across. Do not dig under them or around the cuticle. File the edges of your nails with an emery board or nail file.  Do not cut corns or calluses or try to remove them with medicine.  Wear clean socks or stockings every day. Make sure they are not too tight. Do not wear knee-high stockings since they may decrease blood flow to your legs.  Wear shoes that fit properly and have enough cushioning. To break in new shoes, wear them for just a few hours a day. This prevents you from injuring your feet. Always look in your shoes before you put them on to be sure there are no objects inside.  Do not cross your legs. This may decrease the blood flow to your feet.  If you find a minor scrape,  cut, or break in the skin on your feet, keep it and the skin around it clean and dry. These areas may be cleansed with mild soap and water. Do not cleanse the area with peroxide, alcohol, or iodine.  When you remove an adhesive bandage, be sure not to damage the skin around it.  If you have a wound, look at it several times a day to make sure it is healing.  Do not use heating pads or hot water bottles. They may burn your skin. If you have lost feeling in your feet or legs, you may not know it is happening until it is too late.  Make sure your health care provider performs a complete foot exam at least annually or more often if you have foot problems. Report any cuts, sores, or bruises to your health care provider immediately. SEEK MEDICAL CARE IF:   You have an injury that is not healing.  You have cuts or breaks in the skin.  You have an ingrown nail.  You notice redness on your legs or feet.  You feel burning or tingling in your legs or feet.  You have pain or cramps in your legs and feet.  Your legs or feet are numb.  Your feet always feel cold. SEEK IMMEDIATE MEDICAL CARE IF:   There is increasing redness,   swelling, or pain in or around a wound.  There is a red line that goes up your leg.  Pus is coming from a wound.  You develop a fever or as directed by your health care provider.  You notice a bad smell coming from an ulcer or wound. Document Released: 07/02/2000 Document Revised: 03/07/2013 Document Reviewed: 12/12/2012 Exit[employer] Patient Information 2014 [employer].  

## 2014-01-15 ENCOUNTER — Telehealth: Payer: Self-pay | Admitting: *Deleted

## 2014-01-15 NOTE — Telephone Encounter (Signed)
10 upper medication at this time from one at bedtime to 2 Lyrica a day: New prescription for Lyrica 150 mg, dispense 60 tablets, one by mouth twice a day and authorize 2 additional refills. Extra graft Alvan Dameichard Brendolyn Stockley DPM

## 2014-01-15 NOTE — Telephone Encounter (Signed)
He put me on a prescription of Lyrica.  It's time to refill, I'm still having the foot pain.  We talked about increasing the dose.  Does he want me to go ahead and increase it or try it for another month?  You can leave a message on my cell.

## 2014-01-16 ENCOUNTER — Telehealth: Payer: Self-pay | Admitting: *Deleted

## 2014-01-16 MED ORDER — PREGABALIN 150 MG PO CAPS: 150.0000 mg | ORAL_CAPSULE | Freq: Two times a day (BID) | ORAL | Status: DC

## 2014-01-16 NOTE — Telephone Encounter (Signed)
I left the patient a message to come by the office to pick up her prescription.  It's for Lyrica 150mg , take one tablet twice a day.  It cannot be called into the pharmacy.  It will be up front at check-in.  Call if you have any questions.

## 2014-01-16 NOTE — Telephone Encounter (Signed)
I'm a little concerned.  I had called and asked for my Lyrica to be increased.  I received a call that he was changing it to 150mg  twice a day.  Is that normal to take a jump like that?  I told her that's what he suggested.  She said I'm afraid it may be too much to take it during the day.  I told her if it's too much take one tablet a day.  She said she will try it this weekend, if she sleeps all day it will be okay.  She said she will come by tomorrow to pick up the prescription because she had forgot.

## 2014-01-21 ENCOUNTER — Telehealth: Payer: Self-pay | Admitting: *Deleted

## 2014-01-21 NOTE — Telephone Encounter (Signed)
He prescribed Lyrica 150mg  twice a day because the 100mg  wasn't working.  I tried 150mg  once a day on Thursday, Friday and Saturday.  It's making me dizzy, puts me in a fog and I can't wake up.  I realize now that the 100mg  was making me dizzy too, I just didn't know it.   Is there something else he can prescribe?

## 2014-01-22 NOTE — Telephone Encounter (Signed)
I called and left a message that Dr. Ralene CorkSikora said all of the medicines that treat your condition can have the same affects, the dizziness and sleepiness.  He said if you want to go back to taking 100mg  you can.  If you have any question please give us a call back.

## 2014-02-22 ENCOUNTER — Ambulatory Visit (INDEPENDENT_AMBULATORY_CARE_PROVIDER_SITE_OTHER): Payer: PRIVATE HEALTH INSURANCE

## 2014-02-22 VITALS — BP 136/81 | HR 78 | Resp 16

## 2014-02-22 DIAGNOSIS — E1140 Type 2 diabetes mellitus with diabetic neuropathy, unspecified: Secondary | ICD-10-CM

## 2014-02-22 DIAGNOSIS — M722 Plantar fascial fibromatosis: Secondary | ICD-10-CM

## 2014-02-22 DIAGNOSIS — E1142 Type 2 diabetes mellitus with diabetic polyneuropathy: Secondary | ICD-10-CM

## 2014-02-22 DIAGNOSIS — E1341 Other specified diabetes mellitus with diabetic mononeuropathy: Secondary | ICD-10-CM

## 2014-02-22 DIAGNOSIS — E1349 Other specified diabetes mellitus with other diabetic neurological complication: Secondary | ICD-10-CM

## 2014-02-22 DIAGNOSIS — E1149 Type 2 diabetes mellitus with other diabetic neurological complication: Secondary | ICD-10-CM

## 2014-02-22 NOTE — Patient Instructions (Signed)
Diabetes and Foot Care Diabetes may cause you to have problems because of poor blood supply (circulation) to your feet and legs. This may cause the skin on your feet to become thinner, break easier, and heal more slowly. Your skin may become dry, and the skin may peel and crack. You may also have nerve damage in your legs and feet causing decreased feeling in them. You may not notice minor injuries to your feet that could lead to infections or more serious problems. Taking care of your feet is one of the most important things you can do for yourself.  HOME CARE INSTRUCTIONS  Wear shoes at all times, even in the house. Do not go barefoot. Bare feet are easily injured.  Check your feet daily for blisters, cuts, and redness. If you cannot see the bottom of your feet, use a mirror or ask someone for help.  Wash your feet with warm water (do not use hot water) and mild soap. Then pat your feet and the areas between your toes until they are completely dry. Do not soak your feet as this can dry your skin.  Apply a moisturizing lotion or petroleum jelly (that does not contain alcohol and is unscented) to the skin on your feet and to dry, brittle toenails. Do not apply lotion between your toes.  Trim your toenails straight across. Do not dig under them or around the cuticle. File the edges of your nails with an emery board or nail file.  Do not cut corns or calluses or try to remove them with medicine.  Wear clean socks or stockings every day. Make sure they are not too tight. Do not wear knee-high stockings since they may decrease blood flow to your legs.  Wear shoes that fit properly and have enough cushioning. To break in new shoes, wear them for just a few hours a day. This prevents you from injuring your feet. Always look in your shoes before you put them on to be sure there are no objects inside.  Do not cross your legs. This may decrease the blood flow to your feet.  If you find a minor scrape,  cut, or break in the skin on your feet, keep it and the skin around it clean and dry. These areas may be cleansed with mild soap and water. Do not cleanse the area with peroxide, alcohol, or iodine.  When you remove an adhesive bandage, be sure not to damage the skin around it.  If you have a wound, look at it several times a day to make sure it is healing.  Do not use heating pads or hot water bottles. They may burn your skin. If you have lost feeling in your feet or legs, you may not know it is happening until it is too late.  Make sure your health care provider performs a complete foot exam at least annually or more often if you have foot problems. Report any cuts, sores, or bruises to your health care provider immediately. SEEK MEDICAL CARE IF:   You have an injury that is not healing.  You have cuts or breaks in the skin.  You have an ingrown nail.  You notice redness on your legs or feet.  You feel burning or tingling in your legs or feet.  You have pain or cramps in your legs and feet.  Your legs or feet are numb.  Your feet always feel cold. SEEK IMMEDIATE MEDICAL CARE IF:   There is increasing redness,   swelling, or pain in or around a wound.  There is a red line that goes up your leg.  Pus is coming from a wound.  You develop a fever or as directed by your health care provider.  You notice a bad smell coming from an ulcer or wound. Document Released: 07/02/2000 Document Revised: 03/07/2013 Document Reviewed: 12/12/2012 Exit[employer] Patient Information 2015 [employer]. This information is not intended to replace advice given to you by your health care provider. Make sure you discuss any questions you have with your health care provider.  

## 2014-02-22 NOTE — Progress Notes (Signed)
   Subjective:    Patient ID: Christina Howard, female    DOB: [birth year], 48 y.o.   MRN: [id number]  HPI Comments: "They seem to be better"  Follow up peripheral neuropathy bilateral      Review of Systems no new findings or systemic changes no    Objective:   Physical Exam Lower extremity objective findings reveal intact palpable pedal pulses DP postal for PT one over 4 capillary refill 3 seconds epicritic sensations diminished slightly although normal plantar response and DTRs noted. The neuropathy as responded to the use of Lyrica however could not tolerate and 50 mg twice a day and status is taking 50 mg at bedtime however x-rays taken out for in the evening due the morning pain over affected she takes it later. Patient had reduction of neuropathy symptoms seems to be doing well with this patient's A1c is last recorded 6.7 is scheduled for test sometime in the next month or 2. Begin open wounds ulcerations no secondary infections no other complications her complaints at this visit       Assessment & Plan:  Assessment diabetes with history peripheral neuropathy and neuralgia is being managed controlled with Lyrica followup as needed for future reevaluation suggest a 6-12 month diabetic neuropathy evaluation patient will continue with Lyrica each bedtime.  Alvan Dameichard Sikora DPM

## 2014-06-30 ENCOUNTER — Other Ambulatory Visit: Payer: Self-pay

## 2014-07-03 ENCOUNTER — Other Ambulatory Visit: Payer: Self-pay

## 2014-07-08 ENCOUNTER — Telehealth: Payer: Self-pay | Admitting: *Deleted

## 2014-07-08 MED ORDER — PREGABALIN 150 MG PO CAPS: 150.0000 mg | ORAL_CAPSULE | Freq: Two times a day (BID) | ORAL | Status: DC

## 2014-07-08 NOTE — Telephone Encounter (Signed)
Prescription refill for Lyrica was [facility] with 2 additional refills per Dr. Ralene CorkSikora.

## 2014-08-27 ENCOUNTER — Ambulatory Visit (INDEPENDENT_AMBULATORY_CARE_PROVIDER_SITE_OTHER): Payer: PRIVATE HEALTH INSURANCE

## 2014-08-27 VITALS — BP 117/74 | HR 86 | Resp 12

## 2014-08-27 DIAGNOSIS — E1140 Type 2 diabetes mellitus with diabetic neuropathy, unspecified: Secondary | ICD-10-CM

## 2014-08-27 DIAGNOSIS — E1341 Other specified diabetes mellitus with diabetic mononeuropathy: Secondary | ICD-10-CM

## 2014-08-27 MED ORDER — PREGABALIN 150 MG PO CAPS: 150.0000 mg | ORAL_CAPSULE | Freq: Every day | ORAL | Status: DC

## 2014-08-27 NOTE — Progress Notes (Signed)
   Subjective:    Patient ID: Christina Howard, female    DOB: 04/14/1966, 49 y.o.   MRN: [id number]  HPI  ''B/L FOOT ARE DOING MUCH BETTER, BUT SOMETIMES GET SORE.''  Review of Systems no new findings or systemic changes noted     Objective:   Physical Exam Neurovascular status is intact pedal pulses DP +2 PT 1 over 4 bilateral Refill time 3 seconds there is neuritis and neuropathy being managed well with Lyrica 150 mg daily at bedtime. No other new changes noted her last A1c was 6.1 diabetes well-managed locations knee difficulties orthopedic biomechanical exam unremarkable neurovascular findings unremarkable       Assessment & Plan:  Assessment diabetes with history peripheral neuropathy neuralgia responding to the Lyrica will continue with Lyrica dosing 3 mg daily at bedtime reappointed for future follow-up release angle check up in 12 months  Alvan Dameichard Eleana Tocco DPM

## 2014-08-27 NOTE — Patient Instructions (Signed)
Diabetes and Foot Care Diabetes may cause you to have problems because of poor blood supply (circulation) to your feet and legs. This may cause the skin on your feet to become thinner, break easier, and heal more slowly. Your skin may become dry, and the skin may peel and crack. You may also have nerve damage in your legs and feet causing decreased feeling in them. You may not notice minor injuries to your feet that could lead to infections or more serious problems. Taking care of your feet is one of the most important things you can do for yourself.  HOME CARE INSTRUCTIONS  Wear shoes at all times, even in the house. Do not go barefoot. Bare feet are easily injured.  Check your feet daily for blisters, cuts, and redness. If you cannot see the bottom of your feet, use a mirror or ask someone for help.  Wash your feet with warm water (do not use hot water) and mild soap. Then pat your feet and the areas between your toes until they are completely dry. Do not soak your feet as this can dry your skin.  Apply a moisturizing lotion or petroleum jelly (that does not contain alcohol and is unscented) to the skin on your feet and to dry, brittle toenails. Do not apply lotion between your toes.  Trim your toenails straight across. Do not dig under them or around the cuticle. File the edges of your nails with an emery board or nail file.  Do not cut corns or calluses or try to remove them with medicine.  Wear clean socks or stockings every day. Make sure they are not too tight. Do not wear knee-high stockings since they may decrease blood flow to your legs.  Wear shoes that fit properly and have enough cushioning. To break in new shoes, wear them for just a few hours a day. This prevents you from injuring your feet. Always look in your shoes before you put them on to be sure there are no objects inside.  Do not cross your legs. This may decrease the blood flow to your feet.  If you find a minor scrape,  cut, or break in the skin on your feet, keep it and the skin around it clean and dry. These areas may be cleansed with mild soap and water. Do not cleanse the area with peroxide, alcohol, or iodine.  When you remove an adhesive bandage, be sure not to damage the skin around it.  If you have a wound, look at it several times a day to make sure it is healing.  Do not use heating pads or hot water bottles. They may burn your skin. If you have lost feeling in your feet or legs, you may not know it is happening until it is too late.  Make sure your health care provider performs a complete foot exam at least annually or more often if you have foot problems. Report any cuts, sores, or bruises to your health care provider immediately. SEEK MEDICAL CARE IF:   You have an injury that is not healing.  You have cuts or breaks in the skin.  You have an ingrown nail.  You notice redness on your legs or feet.  You feel burning or tingling in your legs or feet.  You have pain or cramps in your legs and feet.  Your legs or feet are numb.  Your feet always feel cold. SEEK IMMEDIATE MEDICAL CARE IF:   There is increasing redness,   swelling, or pain in or around a wound.  There is a red line that goes up your leg.  Pus is coming from a wound.  You develop a fever or as directed by your health care provider.  You notice a bad smell coming from an ulcer or wound. Document Released: 07/02/2000 Document Revised: 03/07/2013 Document Reviewed: 12/12/2012 Exit[employer] Patient Information 2015 [employer]. This information is not intended to replace advice given to you by your health care provider. Make sure you discuss any questions you have with your health care provider.  

## 2015-01-15 ENCOUNTER — Telehealth: Payer: Self-pay | Admitting: *Deleted

## 2015-01-15 NOTE — Telephone Encounter (Signed)
Pt states the Lyrica 150mg  every day is not helping her feet any longer, does she need an earlier appt with Dr. Stacie AcresMayer.

## 2015-01-28 ENCOUNTER — Ambulatory Visit (INDEPENDENT_AMBULATORY_CARE_PROVIDER_SITE_OTHER): Payer: PRIVATE HEALTH INSURANCE | Admitting: Podiatry

## 2015-01-28 ENCOUNTER — Encounter: Payer: Self-pay | Admitting: Podiatry

## 2015-01-28 VITALS — BP 122/77 | HR 72 | Temp 98.1°F | Resp 15 | Ht 64.0 in | Wt 170.0 lb

## 2015-01-28 DIAGNOSIS — M79673 Pain in unspecified foot: Secondary | ICD-10-CM | POA: Diagnosis not present

## 2015-01-28 DIAGNOSIS — G629 Polyneuropathy, unspecified: Secondary | ICD-10-CM | POA: Diagnosis not present

## 2015-01-28 DIAGNOSIS — E1142 Type 2 diabetes mellitus with diabetic polyneuropathy: Secondary | ICD-10-CM | POA: Diagnosis not present

## 2015-01-28 MED ORDER — PREGABALIN 50 MG PO CAPS: 50.0000 mg | ORAL_CAPSULE | Freq: Two times a day (BID) | ORAL | Status: DC

## 2015-01-28 NOTE — Patient Instructions (Addendum)
Stop using the Lyrica 150 mg  I prescribed 50 mg Lyrica At bedtime take 2     50 mg Lyrica capsules Midday take one  50 mg wear

## 2015-01-28 NOTE — Progress Notes (Signed)
   Subjective:    Patient ID: Christina Howard, female    DOB: [birth year], 49 y.o.   MRN: [id number]  HPI Patent presents with bilateral foot pain. By the end of the day, patient's feet hurt worse. This patient was last evaluated in 08/27/2014 by Dr. Ralene CorkSikora at that time Lyrica 150 mg was prescribed at bedtime which controlled the painful burning sensations until more recently. She now has increased painful, burning and tingling in her feet during the day. the Lyrica 150 mg cause extreme drowsiness and she must take it at dinnertime  Patient denies any history of foot ulceration or claudication  Review of Systems  All other systems reviewed and are negative.      Objective:   Physical Exam  Orientated 3  Vascular: No peripheral edema noted bilaterally DP and PT pulses 2/4 bilaterally Capillary reflex media bilaterally  Neurological: Sensation to 10 g monofilament wire intact 5/5 bilaterally Vibratory sensation reactive bilaterally Ankle reflex equal and reactive bilaterally  Dermatological: Texture and turgor within normal limits  Musculoskeletal: Rectus foot type bilaterally No foot deformities noted bilaterally       Assessment & Plan:   Assessment: Painful diabetic peripheral neuropathy  Plan:  At this time because patient has extreme drowsiness which she takes 150 mg Lyrica at one time I will modify the dosing of Lyrica  Discontinue Lyrica 150 mg capsules  Rx Lyrica 50 mg #90. Tak  Two 50 mg at bedtime Take one 50 mg a.m.midday  Reevaluate 30 days

## 2015-02-25 ENCOUNTER — Encounter: Payer: Self-pay | Admitting: Podiatry

## 2015-02-25 ENCOUNTER — Ambulatory Visit (INDEPENDENT_AMBULATORY_CARE_PROVIDER_SITE_OTHER): Payer: PRIVATE HEALTH INSURANCE | Admitting: Podiatry

## 2015-02-25 ENCOUNTER — Ambulatory Visit: Payer: PRIVATE HEALTH INSURANCE | Admitting: Podiatry

## 2015-02-25 VITALS — BP 129/88 | HR 72 | Resp 14

## 2015-02-25 DIAGNOSIS — E1142 Type 2 diabetes mellitus with diabetic polyneuropathy: Secondary | ICD-10-CM | POA: Diagnosis not present

## 2015-02-25 DIAGNOSIS — G629 Polyneuropathy, unspecified: Secondary | ICD-10-CM | POA: Diagnosis not present

## 2015-02-25 DIAGNOSIS — M79673 Pain in unspecified foot: Secondary | ICD-10-CM | POA: Diagnosis not present

## 2015-02-25 NOTE — Progress Notes (Signed)
   Subjective:    Patient ID: Christina Howard, female    DOB: [birth year], 49 y.o.   MRN: [id number]  HPI This patient presents for follow-up visit of 01/28/2015. She is having painful neuropathy and her right and left feet and is currently taking Lyrica to control symptoms. On the previous visit she was complaining that the Lyrica 150 mg cause extreme drowsiness wish not able to tolerate the 150 mg at bedtime. At that visit I recommended that she take 250 mg Lyrica bedtime and then 150 mg at midday. She said she tried this, however, he had extreme pain when she only used 100 mg at bedtime. She said that she then resumed of Lyrica 150 mg at bedtime and 50 mg at midday. She said she was able to tolerate the 150 mg without any excessive drowsiness. She says that the pain under this medicine dosing has reduced her pain from 10 to about 6 out of 10. She also denies any fluid retention or suicidal thoughts   Review of Systems  Hematological: Bruises/bleeds easily.  All other systems reviewed and are negative.      Objective:   Physical Exam  Orientated 3  Vascular: No peripheral edema noted bilaterally DP and PT pulses 2/4 bilaterally  Neurological: Sensation to 10 g monofilament wire intact 5/5 bilaterally Vibratory sensation reactive bilaterally Ankle reflex equal and reactive bilaterally  Dermatological: Texture and turgor within normal limits  Musculoskeletal: No deformities noted       Assessment & Plan:   Assessment: Painful diabetic peripheral neuropathy Lyrica is reducing some of the symptoms with satisfactory tolerance of Lyrica  Plan: At this time patient will maintain Lyrica 150 mg at bedtime and 50 mg midday  Refill Lyrica as needed at patient's request Reappoint yearly or at patient's request

## 2015-02-25 NOTE — Patient Instructions (Signed)
To control diabetic peripheral neuropathy At bedtime take one 150 mg Lyrica At midday take one 50 mg Lyrica  Return as needed or yearly   Diabetes and Foot Care Diabetes may cause you to have problems because of poor blood supply (circulation) to your feet and legs. This may cause the skin on your feet to become thinner, break easier, and heal more slowly. Your skin may become dry, and the skin may peel and crack. You may also have nerve damage in your legs and feet causing decreased feeling in them. You may not notice minor injuries to your feet that could lead to infections or more serious problems. Taking care of your feet is one of the most important things you can do for yourself.  HOME CARE INSTRUCTIONS  Wear shoes at all times, even in the house. Do not go barefoot. Bare feet are easily injured.  Check your feet daily for blisters, cuts, and redness. If you cannot see the bottom of your feet, use a mirror or ask someone for help.  Wash your feet with warm water (do not use hot water) and mild soap. Then pat your feet and the areas between your toes until they are completely dry. Do not soak your feet as this can dry your skin.  Apply a moisturizing lotion or petroleum jelly (that does not contain alcohol and is unscented) to the skin on your feet and to dry, brittle toenails. Do not apply lotion between your toes.  Trim your toenails straight across. Do not dig under them or around the cuticle. File the edges of your nails with an emery board or nail file.  Do not cut corns or calluses or try to remove them with medicine.  Wear clean socks or stockings every day. Make sure they are not too tight. Do not wear knee-high stockings since they may decrease blood flow to your legs.  Wear shoes that fit properly and have enough cushioning. To break in new shoes, wear them for just a few hours a day. This prevents you from injuring your feet. Always look in your shoes before you put them on  to be sure there are no objects inside.  Do not cross your legs. This may decrease the blood flow to your feet.  If you find a minor scrape, cut, or break in the skin on your feet, keep it and the skin around it clean and dry. These areas may be cleansed with mild soap and water. Do not cleanse the area with peroxide, alcohol, or iodine.  When you remove an adhesive bandage, be sure not to damage the skin around it.  If you have a wound, look at it several times a day to make sure it is healing.  Do not use heating pads or hot water bottles. They may burn your skin. If you have lost feeling in your feet or legs, you may not know it is happening until it is too late.  Make sure your health care provider performs a complete foot exam at least annually or more often if you have foot problems. Report any cuts, sores, or bruises to your health care provider immediately. SEEK MEDICAL CARE IF:   You have an injury that is not healing.  You have cuts or breaks in the skin.  You have an ingrown nail.  You notice redness on your legs or feet.  You feel burning or tingling in your legs or feet.  You have pain or cramps in your  legs and feet.  Your legs or feet are numb.  Your feet always feel cold. SEEK IMMEDIATE MEDICAL CARE IF:   There is increasing redness, swelling, or pain in or around a wound.  There is a red line that goes up your leg.  Pus is coming from a wound.  You develop a fever or as directed by your health care provider.  You notice a bad smell coming from an ulcer or wound. Document Released: 07/02/2000 Document Revised: 03/07/2013 Document Reviewed: 12/12/2012 [facility] Patient Information 2015 [place], [place]. This information is not intended to replace advice given to you by your health care provider. Make sure you discuss any questions you have with your health care provider.

## 2015-05-13 ENCOUNTER — Other Ambulatory Visit: Payer: Self-pay

## 2015-05-13 ENCOUNTER — Telehealth: Payer: Self-pay

## 2015-05-13 DIAGNOSIS — E1142 Type 2 diabetes mellitus with diabetic polyneuropathy: Secondary | ICD-10-CM

## 2015-05-13 MED ORDER — PREGABALIN 50 MG PO CAPS: 50.0000 mg | ORAL_CAPSULE | Freq: Two times a day (BID) | ORAL | Status: DC

## 2015-05-13 NOTE — Telephone Encounter (Signed)
Left message for pt to call with corrected pharmacy information. Ok to refill Lyrica per Dr Ardelle AntonWagoner

## 2015-05-13 NOTE — Telephone Encounter (Signed)
Pt called requesting refill on her Lyrica Rx, [facility]

## 2015-05-13 NOTE — Telephone Encounter (Signed)
OK to refill

## 2015-05-16 ENCOUNTER — Telehealth: Payer: Self-pay | Admitting: *Deleted

## 2015-05-16 NOTE — Telephone Encounter (Signed)
Faxed form requesting clarification of Lyrica rx due to inability to read faxed rx of 05/13/2015, copied the Lyrica 50 mg rx to the form.

## 2015-05-21 ENCOUNTER — Telehealth: Payer: Self-pay | Admitting: *Deleted

## 2015-05-21 MED ORDER — PREGABALIN 50 MG PO CAPS: ORAL_CAPSULE | ORAL | Status: DC

## 2015-05-21 MED ORDER — PREGABALIN 150 MG PO CAPS: 150.0000 mg | ORAL_CAPSULE | Freq: Two times a day (BID) | ORAL | Status: DC

## 2015-05-21 MED ORDER — PREGABALIN 150 MG PO CAPS: 150.0000 mg | ORAL_CAPSULE | Freq: Every day | ORAL | Status: DC

## 2015-05-21 NOTE — Telephone Encounter (Addendum)
Pt states the wrong mg of Lyrica was refilled she needs the 150mg  Lyrica refilled.  I reviewed pt's medical chart and 02/25/2015 Dr. Leeanne Deeduchman stated pt could take Lyrica 150mg  at bedtime and 50mg  at midday, and to refill prn.  Left message informing pt I would refill as Dr. Leeanne Deeduchman directed [facility] [address]  Done.  Pt called back and states she wants the [facility].  Done.

## 2015-06-23 ENCOUNTER — Telehealth: Payer: Self-pay | Admitting: *Deleted

## 2015-06-23 MED ORDER — PREGABALIN 150 MG PO CAPS: 150.0000 mg | ORAL_CAPSULE | Freq: Every day | ORAL | Status: DC

## 2015-07-14 NOTE — Telephone Encounter (Signed)
Entered in error

## 2015-09-26 ENCOUNTER — Telehealth: Payer: Self-pay | Admitting: *Deleted

## 2015-09-26 MED ORDER — PREGABALIN 150 MG PO CAPS: 150.0000 mg | ORAL_CAPSULE | Freq: Every day | ORAL | Status: DC

## 2015-09-26 MED ORDER — PREGABALIN 50 MG PO CAPS
ORAL_CAPSULE | ORAL | Status: DC
Start: 2015-09-26 — End: 2015-10-14

## 2015-09-26 NOTE — Telephone Encounter (Signed)
Refilled lyrica as previously.

## 2015-10-08 ENCOUNTER — Telehealth: Payer: Self-pay | Admitting: *Deleted

## 2015-10-08 NOTE — Telephone Encounter (Addendum)
Pt states the Lyrica 150mg  rx is correct, but the Lyrica 50mg  that she takes once at lunchtime is not correct.  Left message informing pt I would call again and have her walk me through what she wanted.  10/14/2015-Pt states she takes Lyrica 50mg  at midday, and gets #90 sent to her mail order pharmacy.  Dr. Stephanie Coupuchman okays to refill.  [facility]ed to [facility].

## 2015-10-14 MED ORDER — PREGABALIN 50 MG PO CAPS
ORAL_CAPSULE | ORAL | Status: DC
Start: 2015-10-14 — End: 2016-08-24

## 2016-02-24 ENCOUNTER — Encounter: Payer: Self-pay | Admitting: Podiatry

## 2016-02-24 ENCOUNTER — Ambulatory Visit (INDEPENDENT_AMBULATORY_CARE_PROVIDER_SITE_OTHER): Payer: PRIVATE HEALTH INSURANCE | Admitting: Podiatry

## 2016-02-24 VITALS — BP 141/90 | HR 70 | Resp 12

## 2016-02-24 DIAGNOSIS — E1142 Type 2 diabetes mellitus with diabetic polyneuropathy: Secondary | ICD-10-CM | POA: Diagnosis not present

## 2016-02-24 NOTE — Patient Instructions (Signed)
Today your diabetic foot screen demonstrated adequate circulation and adequate feeling in your feet. Your symptoms are persistent with peripheral neuropathy associated with diabetes Maintain your Lyrica Return yearly or as needed  Diabetes and Foot Care Diabetes may cause you to have problems because of poor blood supply (circulation) to your feet and legs. This may cause the skin on your feet to become thinner, break easier, and heal more slowly. Your skin may become dry, and the skin may peel and crack. You may also have nerve damage in your legs and feet causing decreased feeling in them. You may not notice minor injuries to your feet that could lead to infections or more serious problems. Taking care of your feet is one of the most important things you can do for yourself.  HOME CARE INSTRUCTIONS  Wear shoes at all times, even in the house. Do not go barefoot. Bare feet are easily injured.  Check your feet daily for blisters, cuts, and redness. If you cannot see the bottom of your feet, use a mirror or ask someone for help.  Wash your feet with warm water (do not use hot water) and mild soap. Then pat your feet and the areas between your toes until they are completely dry. Do not soak your feet as this can dry your skin.  Apply a moisturizing lotion or petroleum jelly (that does not contain alcohol and is unscented) to the skin on your feet and to dry, brittle toenails. Do not apply lotion between your toes.  Trim your toenails straight across. Do not dig under them or around the cuticle. File the edges of your nails with an emery board or nail file.  Do not cut corns or calluses or try to remove them with medicine.  Wear clean socks or stockings every day. Make sure they are not too tight. Do not wear knee-high stockings since they may decrease blood flow to your legs.  Wear shoes that fit properly and have enough cushioning. To break in new shoes, wear them for just a few hours a day.  This prevents you from injuring your feet. Always look in your shoes before you put them on to be sure there are no objects inside.  Do not cross your legs. This may decrease the blood flow to your feet.  If you find a minor scrape, cut, or break in the skin on your feet, keep it and the skin around it clean and dry. These areas may be cleansed with mild soap and water. Do not cleanse the area with peroxide, alcohol, or iodine.  When you remove an adhesive bandage, be sure not to damage the skin around it.  If you have a wound, look at it several times a day to make sure it is healing.  Do not use heating pads or hot water bottles. They may burn your skin. If you have lost feeling in your feet or legs, you may not know it is happening until it is too late.  Make sure your health care provider performs a complete foot exam at least annually or more often if you have foot problems. Report any cuts, sores, or bruises to your health care provider immediately. SEEK MEDICAL CARE IF:   You have an injury that is not healing.  You have cuts or breaks in the skin.  You have an ingrown nail.  You notice redness on your legs or feet.  You feel burning or tingling in your legs or feet.  You  have pain or cramps in your legs and feet.  Your legs or feet are numb.  Your feet always feel cold. SEEK IMMEDIATE MEDICAL CARE IF:   There is increasing redness, swelling, or pain in or around a wound.  There is a red line that goes up your leg.  Pus is coming from a wound.  You develop a fever or as directed by your health care provider.  You notice a bad smell coming from an ulcer or wound.   This information is not intended to replace advice given to you by your health care provider. Make sure you discuss any questions you have with your health care provider.   Document Released: 07/02/2000 Document Revised: 03/07/2013 Document Reviewed: 12/12/2012 Elsevier Interactive Patient Education [email or web address]  [employer]

## 2016-02-24 NOTE — Progress Notes (Signed)
   Subjective:    Patient ID: Christina Howard, female    DOB: 05-04-1966, 50 y.o.   MRN: [id number]  HPI This patient presents today requesting a diabetic foot examination. Patient denies any history of ulceration, claudication or amputation in the past year. She complains of intermittent tingling and numbness in her right and left feet on and off weightbearing. Patient continues to take Lyrica 150 mg at bedtime and 50 mg midday. She says this amount of medication controls her unpleasant neuropathic symptoms.   Review of Systems  All other systems reviewed and are negative.      Objective:   Physical Exam  Orientated 3  Vascular: No calf edema or calf tenderness bilaterally No peripheral edema noted bilaterally DP and PT pulses 2/4 bilaterally  Capillary reflex immediate bilaterally Neurological: Sensation to 10 g monofilament wire intact 5/5 bilaterally Vibratory sensation reactive bilaterally Ankle reflex equal and reactive bilaterally  Dermatological: Texture and turgor within normal limits  Musculoskeletal: No deformities noted Manual motor testing: Dorsi flexion, plantar flexion, inversion, eversion 5/5 bilaterally There is no restriction of ankle, subtalar, midtarsal joints bilaterally       Assessment & Plan:   Assessment: Satisfactory neurovascular status Protective sensation intact Diabetic with peripheral neuropathy symptoms  Plan: Today I reviewed the results of examination with patient today. I recommended she continue her Lyrica as previously prescribed General diabetic foot instructions provided and after visit summary  Return as needed or yearly

## 2016-04-08 ENCOUNTER — Telehealth: Payer: Self-pay | Admitting: *Deleted

## 2016-04-08 MED ORDER — PREGABALIN 150 MG PO CAPS: ORAL_CAPSULE | ORAL | 3 refills | Status: DC

## 2016-04-08 MED ORDER — PREGABALIN 50 MG PO CAPS: ORAL_CAPSULE | ORAL | 3 refills | Status: DC

## 2016-04-08 NOTE — Telephone Encounter (Addendum)
Pt states her pharmacy has been trying to get the Lyrica 50mg  #90 and Lyrica 150mg  #90 refilled, and asked if she would contact our office. I left message informing pt the request had been received 04/05/2016 and would be refilled today for one year. Completed [facility] form for Lyrica 50mg  and 150mg  faxed to [phone].

## 2016-08-24 ENCOUNTER — Encounter: Payer: Self-pay | Admitting: Podiatry

## 2016-08-24 ENCOUNTER — Ambulatory Visit (INDEPENDENT_AMBULATORY_CARE_PROVIDER_SITE_OTHER): Payer: PRIVATE HEALTH INSURANCE | Admitting: Podiatry

## 2016-08-24 DIAGNOSIS — E1142 Type 2 diabetes mellitus with diabetic polyneuropathy: Secondary | ICD-10-CM

## 2016-08-24 MED ORDER — PREGABALIN 50 MG PO CAPS: ORAL_CAPSULE | ORAL | 3 refills | Status: DC

## 2016-08-24 MED ORDER — PREGABALIN 150 MG PO CAPS: ORAL_CAPSULE | ORAL | 3 refills | Status: DC

## 2016-08-25 NOTE — Progress Notes (Signed)
She presents today states that the Lyrica seems to no longer be controlling her neuropathy as well as and has been.  Objective: Vital signs are stable she is alert and oriented 3. At this point based on previous evaluation physical exam is unchanged pulses remain palpable no open lesions or wounds. No calf pain.  Assessment: Diabetic peripheral neuropathy associated with type 2 diabetes.  Plan: Discussed etiology and pathology conservative versus surgical therapy. Started her Lyrica 50 mg in the morning with breakfast 50 mg at lunchtime and 150 mg at bedtime. We will follow up with her in 2-3 months unless this is not adequate.

## 2016-08-31 ENCOUNTER — Telehealth: Payer: Self-pay | Admitting: *Deleted

## 2016-08-31 NOTE — Telephone Encounter (Signed)
[facility] states Lyrica cannot ordered on a pre-printed script, must be handwritten. Handwritten script faxed to [facility].

## 2016-11-23 ENCOUNTER — Ambulatory Visit (INDEPENDENT_AMBULATORY_CARE_PROVIDER_SITE_OTHER): Payer: PRIVATE HEALTH INSURANCE | Admitting: Podiatry

## 2016-11-23 ENCOUNTER — Encounter: Payer: Self-pay | Admitting: Podiatry

## 2016-11-23 DIAGNOSIS — E1142 Type 2 diabetes mellitus with diabetic polyneuropathy: Secondary | ICD-10-CM | POA: Diagnosis not present

## 2016-11-23 MED ORDER — PREGABALIN 75 MG PO CAPS: ORAL_CAPSULE | ORAL | 2 refills | Status: DC

## 2016-11-23 NOTE — Progress Notes (Signed)
She presents today for follow-up of her Lyrica. She states that she is doing approximately 80% better though she gets sharp shooting pains every once in a while.  Objective: Vital signs are stable alert and oriented 3. No change in physical exam.  Assessment: Diabetic peripheral neuropathy. Seems to be well controlled with Lyrica.  Plan: We will increase her Lyrica today 75 mg once in the morning once at lunch and 2 at bedtime. Follow-up with her in 3 months

## 2017-02-22 ENCOUNTER — Encounter: Payer: Self-pay | Admitting: Podiatry

## 2017-02-22 ENCOUNTER — Ambulatory Visit (INDEPENDENT_AMBULATORY_CARE_PROVIDER_SITE_OTHER): Payer: PRIVATE HEALTH INSURANCE | Admitting: Podiatry

## 2017-02-22 DIAGNOSIS — E1142 Type 2 diabetes mellitus with diabetic polyneuropathy: Secondary | ICD-10-CM | POA: Diagnosis not present

## 2017-02-22 MED ORDER — PREGABALIN 75 MG PO CAPS: ORAL_CAPSULE | ORAL | 3 refills | Status: DC

## 2017-02-23 ENCOUNTER — Ambulatory Visit: Payer: PRIVATE HEALTH INSURANCE | Admitting: Podiatry

## 2017-02-23 NOTE — Progress Notes (Signed)
She presents today states my sugars have been out of whack and offensive breakthrough sharp pain. She states that all in all the current Lyrica regimen seems to work well for her. She still takes 75 mg at breakfast's 75 mg at lunch and 150 mg at bedtime.  Objective: Pulses remain palpable no open lesions or wounds. No change in neurovascular status.  Assessment diabetic peripheral neuropathy  Plan: Discussed the possibility of adding more medication today her breakthrough pain becomes more chronic issue otherwise I will follow up with her in 6 months we refill her prescription today

## 2017-05-23 ENCOUNTER — Other Ambulatory Visit: Payer: Self-pay | Admitting: Family Medicine

## 2017-05-23 DIAGNOSIS — Z1231 Encounter for screening mammogram for malignant neoplasm of breast: Secondary | ICD-10-CM

## 2017-06-16 ENCOUNTER — Ambulatory Visit
Admission: RE | Admit: 2017-06-16 | Discharge: 2017-06-16 | Disposition: A | Discharge status: Home / Self Care | Payer: PRIVATE HEALTH INSURANCE | Source: Ambulatory Visit | Attending: Family Medicine | Admitting: Family Medicine

## 2017-06-16 DIAGNOSIS — Z1231 Encounter for screening mammogram for malignant neoplasm of breast: Secondary | ICD-10-CM

## 2017-08-30 ENCOUNTER — Ambulatory Visit: Payer: PRIVATE HEALTH INSURANCE | Admitting: Podiatry

## 2017-09-06 ENCOUNTER — Encounter: Payer: Self-pay | Admitting: Podiatry

## 2017-09-06 ENCOUNTER — Ambulatory Visit: Payer: PRIVATE HEALTH INSURANCE | Admitting: Podiatry

## 2017-09-06 DIAGNOSIS — E1142 Type 2 diabetes mellitus with diabetic polyneuropathy: Secondary | ICD-10-CM

## 2017-09-06 MED ORDER — MELOXICAM 15 MG PO TABS: 15.0000 mg | ORAL_TABLET | Freq: Every day | ORAL | 3 refills | Status: DC

## 2017-09-06 MED ORDER — PREGABALIN 75 MG PO CAPS: ORAL_CAPSULE | ORAL | 3 refills | Status: DC

## 2017-09-07 NOTE — Progress Notes (Signed)
She presents today for follow-up of her diabetic peripheral neuropathy.  States that she is is doing better but she still has some shooting pains at nighttime she states that it seems to be worse as she gets up on her feet at nighttime she states that it seems to be worse if she is been on her feet a lot during the day.  Objective: Vital signs are stable alert and oriented x3.  Pulses are palpable.  No reproducible pain on palpation today.  Assessment: Diabetic peripheral neuropathy however I do think that she is having some capsulitis and arthropathic type pain.  Plan: We will continue on her Lyrica but we are going to add  meloxicam which we will taken in mid afternoon.  I will follow-up with her in 1 month regarding this.

## 2017-09-26 ENCOUNTER — Telehealth: Payer: Self-pay | Admitting: *Deleted

## 2017-09-26 ENCOUNTER — Other Ambulatory Visit: Payer: Self-pay | Admitting: Podiatry

## 2017-09-26 MED ORDER — MELOXICAM 15 MG PO TABS: 15.0000 mg | ORAL_TABLET | Freq: Every day | ORAL | 0 refills | Status: DC

## 2017-09-26 NOTE — Telephone Encounter (Signed)
[id number]

## 2017-09-26 NOTE — Telephone Encounter (Signed)
Refill meloxicam requested. Refilled once until seen again in office. Return [facility] to [facility].

## 2017-10-04 ENCOUNTER — Ambulatory Visit: Payer: PRIVATE HEALTH INSURANCE | Admitting: Podiatry

## 2017-10-05 ENCOUNTER — Telehealth: Payer: Self-pay | Admitting: *Deleted

## 2017-10-05 MED ORDER — PREGABALIN 75 MG PO CAPS: ORAL_CAPSULE | ORAL | 3 refills | Status: DC

## 2017-10-05 NOTE — Telephone Encounter (Signed)
Pt states she would like her lyrica rx sent to the Mail Order.

## 2017-10-05 NOTE — Telephone Encounter (Signed)
I reviewed Medications and the order for Lyrica had been [facility] to [facility] on 09/26/2017. Then I saw the rx had to be printed. Left message I would fax the order to [facility], that I was sure Ddr. Al CorpusHyatt had forgotten just like I had that the rx had to be faxed for lyrica.

## 2017-10-05 NOTE — Telephone Encounter (Signed)
Faxed lyrica rx to [facility].

## 2017-10-21 ENCOUNTER — Telehealth: Payer: Self-pay | Admitting: Podiatry

## 2017-10-21 ENCOUNTER — Other Ambulatory Visit: Payer: Self-pay

## 2017-10-21 NOTE — Telephone Encounter (Signed)
Hi Valery, I was calling to get my meloxicam refilled. I would like it to go to my mail order [facility] and if it can be written for a 90 day supply that would be wonderful. Thank you.

## 2018-01-29 ENCOUNTER — Other Ambulatory Visit: Payer: Self-pay | Admitting: Podiatry

## 2018-02-01 ENCOUNTER — Telehealth: Payer: Self-pay | Admitting: Podiatry

## 2018-02-01 MED ORDER — MELOXICAM 15 MG PO TABS: 15.0000 mg | ORAL_TABLET | Freq: Every day | ORAL | 0 refills | Status: DC

## 2018-02-01 NOTE — Telephone Encounter (Signed)
Patient would like a refill on Meloxicam. Please call patient back at [phone] °

## 2018-02-01 NOTE — Addendum Note (Signed)
Addended by: Alphia Kava'CONNELL, Renny Gunnarson D on: 02/01/2018 04:40 PM   Modules accepted: Orders

## 2018-02-01 NOTE — Telephone Encounter (Signed)
I informed pt is would refill the meloxicam for 30 days and she would need to be seen, that after her 08/2017 appt Dr. Al CorpusHyatt had wanted to see her in 30 days. Pt states Dr. Al CorpusHyatt had said if the medications worked she could cancel her 30 day appt and come in 6 months. I told pt that sounded like Dr. Al CorpusHyatt and I would refill and we would see her in 02/2018.

## 2018-02-01 NOTE — Telephone Encounter (Signed)
Patient would like a refill on Meloxicam. Please call patient back at [phone]

## 2018-02-21 ENCOUNTER — Encounter: Payer: Self-pay | Admitting: Podiatry

## 2018-02-21 ENCOUNTER — Ambulatory Visit: Payer: PRIVATE HEALTH INSURANCE | Admitting: Podiatry

## 2018-02-21 DIAGNOSIS — E1142 Type 2 diabetes mellitus with diabetic polyneuropathy: Secondary | ICD-10-CM | POA: Diagnosis not present

## 2018-02-21 MED ORDER — MELOXICAM 15 MG PO TABS: 15.0000 mg | ORAL_TABLET | Freq: Every day | ORAL | 3 refills | Status: DC

## 2018-02-22 NOTE — Progress Notes (Signed)
She presents today states that currently she is taking 75 mg of Lyrica at lunch and 150 mg of Lyrica at night and she is doing quite well as long she takes the meloxicam with it.  Objective: Pulses are strongly palpable.  Otherwise there is no change in physical exam.  No open lesions or wounds are noted.  Assessment: Diabetic peripheral neuropathy and osteoarthritis.  Plan: Continue current regimen Lyrica 75 mg at lunch 150 mg at bedtime and meloxicam.  Follow-up with her in 6 months for diabetic exam.

## 2018-08-01 ENCOUNTER — Telehealth: Payer: Self-pay | Admitting: Podiatry

## 2018-08-01 MED ORDER — MELOXICAM 15 MG PO TABS: 15.0000 mg | ORAL_TABLET | Freq: Every day | ORAL | 0 refills | Status: DC

## 2018-08-01 MED ORDER — PREGABALIN 75 MG PO CAPS: ORAL_CAPSULE | ORAL | 0 refills | Status: DC

## 2018-08-01 NOTE — Telephone Encounter (Signed)
Left message informing pt the meloxicam and lyrica had been refill without refills, she would need an appt with Dr. Al Corpus prior to future refills.

## 2018-08-01 NOTE — Telephone Encounter (Signed)
Pt called requesting refill of Lyrica and Meloxicam. Pt stated that her new preferred pharmacy is [facility]

## 2018-08-01 NOTE — Addendum Note (Signed)
Addended by: Alphia Kava D on: 08/01/2018 11:32 AM   Modules accepted: Orders

## 2018-08-02 ENCOUNTER — Telehealth: Payer: Self-pay | Admitting: Podiatry

## 2018-08-03 NOTE — Telephone Encounter (Signed)
I called [facility] Clydie Braun, pharmacist and gave orders for lyrica 75mg  ordered 08/01/2018. I informed pt I had just called the lyrica to [facility] Clydie Braun, Teacher, early years/pre.

## 2018-08-03 NOTE — Telephone Encounter (Signed)
I spoke to [place] on Tuesday about getting the Meloxicam and Lyrica refilled. [facility] does have the Meloxicam but not the Lyrica.

## 2018-08-11 DIAGNOSIS — Z23 Encounter for immunization: Secondary | ICD-10-CM | POA: Diagnosis not present

## 2018-08-23 DIAGNOSIS — H1045 Other chronic allergic conjunctivitis: Secondary | ICD-10-CM | POA: Diagnosis not present

## 2018-08-24 ENCOUNTER — Ambulatory Visit (INDEPENDENT_AMBULATORY_CARE_PROVIDER_SITE_OTHER): Payer: BLUE CROSS/BLUE SHIELD | Admitting: Podiatry

## 2018-08-24 ENCOUNTER — Encounter: Payer: Self-pay | Admitting: Podiatry

## 2018-08-24 DIAGNOSIS — E1142 Type 2 diabetes mellitus with diabetic polyneuropathy: Secondary | ICD-10-CM | POA: Diagnosis not present

## 2018-08-24 MED ORDER — PREGABALIN 75 MG PO CAPS: 75.0000 mg | ORAL_CAPSULE | Freq: Three times a day (TID) | ORAL | 1 refills | Status: DC

## 2018-08-24 MED ORDER — PREGABALIN 75 MG PO CAPS: 75.0000 mg | ORAL_CAPSULE | Freq: Three times a day (TID) | ORAL | 3 refills | Status: DC

## 2018-08-29 NOTE — Progress Notes (Signed)
She presents today for follow-up of her diabetic peripheral neuropathy states that really nothing is changed and she is doing very well with her hemoglobin A1c.  Objective: Vital signs are stable she is alert and oriented x3.  Pulses are palpable.  Neurologic sensorium is slightly diminished to the tips of the toes per Semmes Weinstein monofilament and vibratory sensation.  No open lesions or wounds are noted.  Assessment: Diabetic peripheral neuropathy otherwise no other complications to the feet.  Plan: Discussed etiology pathology and surgical therapies follow-up with her in 6 months she will watch for signs symptoms of any types of infection to the plantar foot.

## 2018-10-06 ENCOUNTER — Telehealth: Payer: Self-pay | Admitting: *Deleted

## 2018-10-06 MED ORDER — MELOXICAM 15 MG PO TABS: 15.0000 mg | ORAL_TABLET | Freq: Every day | ORAL | 1 refills | Status: DC

## 2018-10-06 NOTE — Telephone Encounter (Signed)
Pt states she will need refills for 90 days on the pregabalin.

## 2018-10-06 NOTE — Telephone Encounter (Signed)
Left message informing pt I had reviewed her last clinicals 08/24/2018, Dr. Al Corpus had ordered Lyrica 75mg  tid, but had cancelled orders for 1 in the morning, 1 at noon and 2 at bedtime. I told her I could not change the prescription without Dr. Geryl Rankins orders, but I could refill the meloxicam

## 2018-10-06 NOTE — Telephone Encounter (Signed)
Pt request call back from the nurse.

## 2018-10-06 NOTE — Telephone Encounter (Signed)
Pt states she was to take 4 pregabalin capsules a day for 90 days, but received prescription for pregabalin to take 3 times a day for 90 days.

## 2018-10-18 NOTE — Telephone Encounter (Signed)
I called pt and informed pt the Lyrica had been sent to [facility] on [address] and escribed through [facility] order. Pt states understanding and will contact the mail order for her medication.

## 2018-10-18 NOTE — Telephone Encounter (Signed)
Patient called following up on refill of Lyrica. Patient states the meloxicam was sent in but does not see where the pharmacy has received the prescription for Lyrica. Please give patient a call.

## 2018-10-25 DIAGNOSIS — L237 Allergic contact dermatitis due to plants, except food: Secondary | ICD-10-CM | POA: Diagnosis not present

## 2018-10-31 NOTE — Telephone Encounter (Signed)
Pt called stating she had received her prescription from [facility] but the prescription was incorrect. The patient received a 30 day supply instead of 90 day and only received enough pills for 3 a day and it should be 4 a day (1 at breakfast, 1 at lunch, 2 at bedtime). Pt stated that when sending in the revised info that if it is escribed it needs to be sent to [facility] [address] which is their mail order number.

## 2018-10-31 NOTE — Telephone Encounter (Signed)
I called pt and informed I could only order the Lyrica as seen in the orders of 08/24/2018, but I could inform Dr. Al Corpus pt's rx states Lyrica 75mg  1 capsule at breakfast, 1 capsule at lunch and 2 capsules at bedtime, and see if he wanted to change the prescription to what is working for her as she is taking.

## 2018-10-31 NOTE — Telephone Encounter (Signed)
Morrie Sheldon I cannot remember any changes made in the rx.  Could you please take care of this.  Thanks.

## 2018-11-07 MED ORDER — PREGABALIN 75 MG PO CAPS: ORAL_CAPSULE | ORAL | 3 refills | Status: DC

## 2018-11-08 ENCOUNTER — Telehealth: Payer: Self-pay | Admitting: Podiatry

## 2018-11-08 MED ORDER — PREGABALIN 75 MG PO CAPS: ORAL_CAPSULE | ORAL | 3 refills | Status: DC

## 2018-11-08 NOTE — Telephone Encounter (Signed)
Patient has an issue with medication that was recently  called in to [facility] order and needs a 90 day supply

## 2018-11-08 NOTE — Addendum Note (Signed)
Addended by: Alphia Kava D on: 11/08/2018 11:24 AM   Modules accepted: Orders

## 2018-11-08 NOTE — Telephone Encounter (Signed)
Rx was sent over electronically yesterday for 90 to [facility] mail order

## 2018-11-08 NOTE — Telephone Encounter (Signed)
Left message informing pt the lyrica was being faxed to [facility] Order for 90 days and 1 refill so she would be seen in office for a 6 month check. Faxed rx to [facility] order.

## 2019-02-06 ENCOUNTER — Other Ambulatory Visit: Payer: Self-pay | Admitting: Podiatry

## 2019-02-06 NOTE — Telephone Encounter (Signed)
Pharmacy called stating patient's meloxicam Rx was shipped out in July from [place], but there's a delay in the post office for delivery, therefore pharmacy wants approval for re-shipment of Rx

## 2019-02-22 ENCOUNTER — Other Ambulatory Visit: Payer: Self-pay

## 2019-02-22 ENCOUNTER — Ambulatory Visit (INDEPENDENT_AMBULATORY_CARE_PROVIDER_SITE_OTHER): Payer: BC Managed Care – PPO | Admitting: Podiatry

## 2019-02-22 ENCOUNTER — Encounter: Payer: Self-pay | Admitting: Podiatry

## 2019-02-22 VITALS — Temp 97.2°F

## 2019-02-22 DIAGNOSIS — E1142 Type 2 diabetes mellitus with diabetic polyneuropathy: Secondary | ICD-10-CM

## 2019-02-22 MED ORDER — MELOXICAM 15 MG PO TABS: 15.0000 mg | ORAL_TABLET | Freq: Every day | ORAL | 3 refills | Status: DC

## 2019-02-22 MED ORDER — PREGABALIN 75 MG PO CAPS: ORAL_CAPSULE | ORAL | 3 refills | Status: DC

## 2019-02-22 NOTE — Progress Notes (Signed)
She presents today for follow-up of her neuropathy and her osteoarthritis.  She states that she is doing well with her diabetes that has started to creep up a little bit because insurance would not cover her medication.  She states that so far the Lyrica has been covering her symptoms as does the meloxicam.  Objective: Vital signs are stable alert and oriented x3.  Pulses are palpable.  Neurologic sensorium is diminished per Semmes Weinstein monofilament.  No open lesions or wounds.  Skin is supple well-hydrated.  No acute findings.  Assessment: Diabetes with diabetic peripheral neuropathy and osteoarthritis.  Plan: At this point I refilled her Lyrica and meloxicam.  I will follow-up with her in 6 months

## 2019-04-12 DIAGNOSIS — E119 Type 2 diabetes mellitus without complications: Secondary | ICD-10-CM | POA: Diagnosis not present

## 2019-04-26 ENCOUNTER — Telehealth: Payer: Self-pay | Admitting: *Deleted

## 2019-04-26 MED ORDER — PREGABALIN 75 MG PO CAPS: ORAL_CAPSULE | ORAL | 3 refills | Status: DC

## 2019-04-26 MED ORDER — PREGABALIN 75 MG PO CAPS: ORAL_CAPSULE | ORAL | 1 refills | Status: DC

## 2019-04-26 MED ORDER — MELOXICAM 15 MG PO TABS: 15.0000 mg | ORAL_TABLET | Freq: Every day | ORAL | 3 refills | Status: DC

## 2019-04-26 NOTE — Telephone Encounter (Signed)
Left message informing pt of Lyrica and Meloxicam being sent to [facility].

## 2019-04-26 NOTE — Telephone Encounter (Signed)
Pt states she would like the Lyrica called to the [facility] order.

## 2019-04-26 NOTE — Telephone Encounter (Signed)
Called [facility] - Melissa transferred to pharmacist - Son and he took the refill order and stated due to lyrica being a controlled substance he could only give her one refill.

## 2019-04-26 NOTE — Telephone Encounter (Signed)
Pt states she saw Dr. Milinda Pointer, 02/22/2019 and she would like refills of her Lyrica 75mg  #360, and Meloxicam 15mg  #90.

## 2019-05-01 ENCOUNTER — Other Ambulatory Visit: Payer: Self-pay | Admitting: *Deleted

## 2019-05-01 MED ORDER — PREGABALIN 75 MG PO CAPS: ORAL_CAPSULE | ORAL | 1 refills | Status: DC

## 2019-06-11 DIAGNOSIS — Z0000 Encounter for general adult medical examination without abnormal findings: Secondary | ICD-10-CM | POA: Diagnosis not present

## 2019-07-04 DIAGNOSIS — E119 Type 2 diabetes mellitus without complications: Secondary | ICD-10-CM | POA: Diagnosis not present

## 2019-07-05 DIAGNOSIS — Z79899 Other long term (current) drug therapy: Secondary | ICD-10-CM | POA: Diagnosis not present

## 2019-07-05 DIAGNOSIS — Z7984 Long term (current) use of oral hypoglycemic drugs: Secondary | ICD-10-CM | POA: Diagnosis not present

## 2019-07-05 DIAGNOSIS — E559 Vitamin D deficiency, unspecified: Secondary | ICD-10-CM | POA: Diagnosis not present

## 2019-07-05 DIAGNOSIS — I10 Essential (primary) hypertension: Secondary | ICD-10-CM | POA: Diagnosis not present

## 2019-07-05 DIAGNOSIS — E119 Type 2 diabetes mellitus without complications: Secondary | ICD-10-CM | POA: Diagnosis not present

## 2019-08-08 DIAGNOSIS — Z20828 Contact with and (suspected) exposure to other viral communicable diseases: Secondary | ICD-10-CM | POA: Diagnosis not present

## 2019-08-08 DIAGNOSIS — Z20822 Contact with and (suspected) exposure to covid-19: Secondary | ICD-10-CM | POA: Diagnosis not present

## 2019-08-23 ENCOUNTER — Ambulatory Visit (INDEPENDENT_AMBULATORY_CARE_PROVIDER_SITE_OTHER): Payer: BC Managed Care – PPO | Admitting: Podiatry

## 2019-08-23 ENCOUNTER — Encounter: Payer: Self-pay | Admitting: Podiatry

## 2019-08-23 ENCOUNTER — Other Ambulatory Visit: Payer: Self-pay

## 2019-08-23 DIAGNOSIS — E1142 Type 2 diabetes mellitus with diabetic polyneuropathy: Secondary | ICD-10-CM | POA: Diagnosis not present

## 2019-08-25 NOTE — Progress Notes (Signed)
She presents today for follow-up of her diabetic peripheral neuropathy bilaterally states that I am doing good for the most part distal little breakthrough pain every now and again.  Objective: Vital signs are stable alert oriented x3 pulses are palpable.  Neurologic sensorium is intact.  Digital reflexes are intact.  Muscle strength is normal symmetrical.  No open lesions or wounds are noted.  Great range of motion of all joints distal to the ankle with full range of motion.  Assessment: Diabetes mellitus with very early peripheral neuropathy.  Plan: Continue use of her current medications and I will follow-up with her in 6 months for recheck.

## 2019-10-30 ENCOUNTER — Telehealth: Payer: Self-pay

## 2019-10-30 MED ORDER — PREGABALIN 75 MG PO CAPS: ORAL_CAPSULE | ORAL | 1 refills | Status: DC

## 2019-10-30 NOTE — Telephone Encounter (Signed)
Received voicemail message from [facility] requesting refill of Lyrica for patient. Please review and send if appropriate Thanks

## 2019-10-30 NOTE — Addendum Note (Signed)
Addended by: Ernestene Kiel T on: 10/30/2019 05:08 PM   Modules accepted: Orders

## 2020-02-21 ENCOUNTER — Ambulatory Visit (INDEPENDENT_AMBULATORY_CARE_PROVIDER_SITE_OTHER): Payer: BC Managed Care – PPO | Admitting: Podiatry

## 2020-02-21 ENCOUNTER — Other Ambulatory Visit: Payer: Self-pay

## 2020-02-21 DIAGNOSIS — E1142 Type 2 diabetes mellitus with diabetic polyneuropathy: Secondary | ICD-10-CM

## 2020-02-21 MED ORDER — CYCLOBENZAPRINE HCL 5 MG PO TABS: 5.0000 mg | ORAL_TABLET | Freq: Three times a day (TID) | ORAL | 1 refills | Status: DC | PRN

## 2020-02-22 ENCOUNTER — Telehealth: Payer: Self-pay | Admitting: Podiatry

## 2020-02-22 MED ORDER — CYCLOBENZAPRINE HCL 5 MG PO TABS: 5.0000 mg | ORAL_TABLET | Freq: Three times a day (TID) | ORAL | 1 refills | Status: DC | PRN

## 2020-02-22 NOTE — Telephone Encounter (Signed)
Pt was seen in office yesterday and her prescription for Flexeril was sent to the incorrect pharmacy. Please resend prescription to [facility] on [address]

## 2020-02-22 NOTE — Telephone Encounter (Signed)
I informed pt the rx had been sent to the [id number].

## 2020-02-22 NOTE — Addendum Note (Signed)
Addended by: Alphia Kava D on: 02/22/2020 02:05 PM   Modules accepted: Orders

## 2020-02-22 NOTE — Progress Notes (Signed)
She presents today for follow-up states that she is doing very well and having had the sharp pain very often.  Has had more aching lately but it comes and goes pain levels a 3 out of 10.  States that she has more arch cramping with knots when she is trying to walk.  Currently she is taking her Lyrica 70 5 in the morning and 75 at lunch and 150 at bedtime.  Objective: Vital signs are stable alert oriented x3 no new findings on physical exam.  Assessment: Diabetic peripheral neuropathy neuropathy appears to be controlled with the Lyrica and the meloxicam.  Plan: However the cramps we did talk about making sure she had enough water intake we also started her on cyclobenzaprine 5 mg 1 twice a day as needed for cramps I will follow-up with her in 3 months may need to consider diabetic shoes or orthotics.

## 2020-03-12 ENCOUNTER — Other Ambulatory Visit: Payer: Self-pay | Admitting: Podiatry

## 2020-03-13 ENCOUNTER — Encounter: Payer: Self-pay | Admitting: *Deleted

## 2020-03-13 NOTE — Progress Notes (Signed)
Received fax for controlled substance reorder request -   Sent in new Rx order for Pregablin 75mg  - take one AM, one PM, two QHS   #360 with 2 refills   Faxed over to order  [phone]

## 2020-04-29 DIAGNOSIS — Z23 Encounter for immunization: Secondary | ICD-10-CM | POA: Diagnosis not present

## 2020-04-29 DIAGNOSIS — L723 Sebaceous cyst: Secondary | ICD-10-CM | POA: Diagnosis not present

## 2020-05-22 ENCOUNTER — Ambulatory Visit (INDEPENDENT_AMBULATORY_CARE_PROVIDER_SITE_OTHER): Payer: BC Managed Care – PPO | Admitting: Podiatry

## 2020-05-22 ENCOUNTER — Other Ambulatory Visit: Payer: Self-pay

## 2020-05-22 ENCOUNTER — Encounter: Payer: Self-pay | Admitting: Podiatry

## 2020-05-22 DIAGNOSIS — E1142 Type 2 diabetes mellitus with diabetic polyneuropathy: Secondary | ICD-10-CM

## 2020-05-22 NOTE — Progress Notes (Signed)
She presents today for follow-up of her diabetic peripheral neuropathy states that the medicine you gave me really helps the cramping and overall I am doing quite well.  Objective: Vital signs are stable she is alert oriented x3 she continues the Lyrica on a regular basis pulses are strong and palpable.  Neurologic sensorium is diminished.  Muscle strength normal.  Assessment: Diabetic peripheral neuropathy with cramping.  Plan: She will continue her 75 mg at lunchtime and 150 mg at bedtime.  Also she will continue her 5 mg of cyclobenzaprine.  I recommended that she try to discontinue the use of the meloxicam.  But if she needs a refill we will refill it.

## 2020-06-20 ENCOUNTER — Other Ambulatory Visit: Payer: Self-pay | Admitting: Podiatry

## 2020-07-07 DIAGNOSIS — N3000 Acute cystitis without hematuria: Secondary | ICD-10-CM | POA: Diagnosis not present

## 2020-07-07 DIAGNOSIS — E119 Type 2 diabetes mellitus without complications: Secondary | ICD-10-CM | POA: Diagnosis not present

## 2020-07-07 DIAGNOSIS — I10 Essential (primary) hypertension: Secondary | ICD-10-CM | POA: Diagnosis not present

## 2020-07-07 DIAGNOSIS — Z79899 Other long term (current) drug therapy: Secondary | ICD-10-CM | POA: Diagnosis not present

## 2020-07-07 DIAGNOSIS — E7800 Pure hypercholesterolemia, unspecified: Secondary | ICD-10-CM | POA: Diagnosis not present

## 2020-07-07 DIAGNOSIS — E559 Vitamin D deficiency, unspecified: Secondary | ICD-10-CM | POA: Diagnosis not present

## 2020-07-07 DIAGNOSIS — Z0000 Encounter for general adult medical examination without abnormal findings: Secondary | ICD-10-CM | POA: Diagnosis not present

## 2020-07-31 ENCOUNTER — Telehealth: Payer: Self-pay | Admitting: *Deleted

## 2020-07-31 NOTE — Telephone Encounter (Signed)
Patient is requesting a refill of Cyclobenzaprine 5mg - 90 day supply([facility] mail order(Meloxicam 15mg  )prescriptions.

## 2020-07-31 NOTE — Telephone Encounter (Signed)
Patient's request for refill on Cyclobenzapine 5mg -90 day supply,Meloxicam 15mg  sent to order.

## 2020-08-01 MED ORDER — MELOXICAM 15 MG PO TABS: 15.0000 mg | ORAL_TABLET | Freq: Every day | ORAL | 0 refills | Status: DC

## 2020-08-01 MED ORDER — CYCLOBENZAPRINE HCL 5 MG PO TABS: 5.0000 mg | ORAL_TABLET | Freq: Three times a day (TID) | ORAL | 0 refills | Status: DC | PRN

## 2020-08-01 NOTE — Telephone Encounter (Signed)
Prescription requests sent into [facility] mail order.

## 2020-10-08 ENCOUNTER — Other Ambulatory Visit: Payer: Self-pay | Admitting: *Deleted

## 2020-10-08 MED ORDER — PREGABALIN 75 MG PO CAPS: ORAL_CAPSULE | ORAL | 3 refills | Status: DC

## 2020-10-08 NOTE — Telephone Encounter (Signed)
Patient requesting refill for Lyrica 75mg  - okay to refill previous order - to order

## 2020-10-09 ENCOUNTER — Telehealth: Payer: Self-pay | Admitting: Podiatry

## 2020-10-09 NOTE — Telephone Encounter (Signed)
Called patient and verified how she is taking the medication. She did say one in morning, one at lunch, two at bedtime. Pharmacy had the same instructions. Verified and they will fill.

## 2020-10-09 NOTE — Telephone Encounter (Signed)
Pt told pharmacy that the instructions were wrong. Pharmacy called requesting a new prescription for the correct instructions (1 in the morning, 1 at lunch and 2 at bedtime. They also stated they could only do 1 refill at a time. Please advise.

## 2020-10-10 DIAGNOSIS — Z7984 Long term (current) use of oral hypoglycemic drugs: Secondary | ICD-10-CM | POA: Diagnosis not present

## 2020-10-10 DIAGNOSIS — E119 Type 2 diabetes mellitus without complications: Secondary | ICD-10-CM | POA: Diagnosis not present

## 2020-10-14 ENCOUNTER — Other Ambulatory Visit: Payer: Self-pay | Admitting: Podiatry

## 2020-10-22 ENCOUNTER — Other Ambulatory Visit: Payer: Self-pay | Admitting: *Deleted

## 2020-10-24 ENCOUNTER — Other Ambulatory Visit: Payer: Self-pay | Admitting: Podiatry

## 2020-10-24 DIAGNOSIS — K1121 Acute sialoadenitis: Secondary | ICD-10-CM | POA: Diagnosis not present

## 2020-11-20 ENCOUNTER — Ambulatory Visit (INDEPENDENT_AMBULATORY_CARE_PROVIDER_SITE_OTHER): Payer: BC Managed Care – PPO | Admitting: Podiatry

## 2020-11-20 ENCOUNTER — Other Ambulatory Visit: Payer: Self-pay

## 2020-11-20 DIAGNOSIS — E1142 Type 2 diabetes mellitus with diabetic polyneuropathy: Secondary | ICD-10-CM | POA: Diagnosis not present

## 2020-11-20 MED ORDER — CYCLOBENZAPRINE HCL 10 MG PO TABS: 10.0000 mg | ORAL_TABLET | Freq: Three times a day (TID) | ORAL | 1 refills | Status: DC | PRN

## 2020-11-20 NOTE — Progress Notes (Signed)
She presents today for her 38-month follow-up for her diabetic peripheral neuropathy.  States that her legs are much more calm with the Flexeril.  And the neuropathy seems to be doing much better.  Objective: Vital signs are stable she is alert and oriented x3 pulses are palpable.  Neurologic sensorium is diminished to the first and second toes of the bilateral foot consistent with her neuropathy from previous evaluation.  Otherwise sensory is intact per [facility] monofilament.  No open lesions or wounds are noted.  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  Plan: I refilled her Flexeril for her.  She will continue her Lyrica 75 mg morning lunch and 150 mg at bedtime.  I will follow-up with her in months for diabetic check.

## 2021-01-15 ENCOUNTER — Other Ambulatory Visit: Payer: Self-pay | Admitting: Podiatry

## 2021-01-15 NOTE — Telephone Encounter (Signed)
Please advise 

## 2021-02-20 DIAGNOSIS — L255 Unspecified contact dermatitis due to plants, except food: Secondary | ICD-10-CM | POA: Diagnosis not present

## 2021-02-23 DIAGNOSIS — L237 Allergic contact dermatitis due to plants, except food: Secondary | ICD-10-CM | POA: Diagnosis not present

## 2021-03-11 ENCOUNTER — Other Ambulatory Visit: Payer: Self-pay | Admitting: Podiatry

## 2021-03-12 NOTE — Telephone Encounter (Signed)
Please advise 

## 2021-05-26 ENCOUNTER — Other Ambulatory Visit: Payer: Self-pay

## 2021-05-26 ENCOUNTER — Encounter: Payer: Self-pay | Admitting: Podiatry

## 2021-05-26 ENCOUNTER — Ambulatory Visit (INDEPENDENT_AMBULATORY_CARE_PROVIDER_SITE_OTHER): Payer: BC Managed Care – PPO | Admitting: Podiatry

## 2021-05-26 DIAGNOSIS — E1142 Type 2 diabetes mellitus with diabetic polyneuropathy: Secondary | ICD-10-CM

## 2021-05-26 MED ORDER — CYCLOBENZAPRINE HCL 10 MG PO TABS: 10.0000 mg | ORAL_TABLET | Freq: Three times a day (TID) | ORAL | 1 refills | Status: DC | PRN

## 2021-05-26 MED ORDER — PREGABALIN 75 MG PO CAPS: ORAL_CAPSULE | ORAL | 3 refills | Status: DC

## 2021-05-26 NOTE — Progress Notes (Signed)
She presents today for follow-up of her diabetes and her neuropathy we check her every 6 months and she states that she has been doing fine.  She states that the medication doses of the Lyrica seems to be working at the appropriate level.  Objective: Vital signs are stable alert oriented x3 pulses are slightly diminished capillary fill time somewhat sluggish but feet are warm to the touch.  Neurologic sensorium is diminished per Semmes Weinstein monofilament to the level of the mid leg and muscle strength is normal and symmetrical bilateral.  Assessment: Diabetes with diabetic peripheral neuropathy and early peripheral arterial disease possibly.  Plan: Encouraged ambulation continue to moisturize the skin and follow-up with [place] in 6 months.  Any open lesions or wounds develop redness develops pain develop she is to notify us immediately.

## 2021-06-02 DIAGNOSIS — M5412 Radiculopathy, cervical region: Secondary | ICD-10-CM | POA: Diagnosis not present

## 2021-06-02 DIAGNOSIS — Z23 Encounter for immunization: Secondary | ICD-10-CM | POA: Diagnosis not present

## 2021-06-10 ENCOUNTER — Other Ambulatory Visit: Payer: Self-pay | Admitting: Family Medicine

## 2021-06-10 DIAGNOSIS — M5412 Radiculopathy, cervical region: Secondary | ICD-10-CM

## 2021-06-22 ENCOUNTER — Ambulatory Visit
Admission: RE | Admit: 2021-06-22 | Discharge: 2021-06-22 | Disposition: A | Discharge status: Home / Self Care | Payer: BC Managed Care – PPO | Source: Ambulatory Visit | Attending: Family Medicine | Admitting: Family Medicine

## 2021-06-22 ENCOUNTER — Other Ambulatory Visit: Payer: Self-pay

## 2021-06-22 DIAGNOSIS — M50123 Cervical disc disorder at C6-C7 level with radiculopathy: Secondary | ICD-10-CM | POA: Diagnosis not present

## 2021-06-22 DIAGNOSIS — M4727 Other spondylosis with radiculopathy, lumbosacral region: Secondary | ICD-10-CM | POA: Diagnosis not present

## 2021-06-22 DIAGNOSIS — M5412 Radiculopathy, cervical region: Secondary | ICD-10-CM

## 2021-07-23 DIAGNOSIS — Z7984 Long term (current) use of oral hypoglycemic drugs: Secondary | ICD-10-CM | POA: Diagnosis not present

## 2021-07-23 DIAGNOSIS — Z0000 Encounter for general adult medical examination without abnormal findings: Secondary | ICD-10-CM | POA: Diagnosis not present

## 2021-07-23 DIAGNOSIS — E559 Vitamin D deficiency, unspecified: Secondary | ICD-10-CM | POA: Diagnosis not present

## 2021-07-23 DIAGNOSIS — E7800 Pure hypercholesterolemia, unspecified: Secondary | ICD-10-CM | POA: Diagnosis not present

## 2021-07-23 DIAGNOSIS — E119 Type 2 diabetes mellitus without complications: Secondary | ICD-10-CM | POA: Diagnosis not present

## 2021-07-23 DIAGNOSIS — Z23 Encounter for immunization: Secondary | ICD-10-CM | POA: Diagnosis not present

## 2021-07-23 DIAGNOSIS — Z79899 Other long term (current) drug therapy: Secondary | ICD-10-CM | POA: Diagnosis not present

## 2021-08-05 DIAGNOSIS — M5412 Radiculopathy, cervical region: Secondary | ICD-10-CM | POA: Diagnosis not present

## 2021-08-28 ENCOUNTER — Other Ambulatory Visit: Payer: Self-pay | Admitting: *Deleted

## 2021-08-28 MED ORDER — MELOXICAM 15 MG PO TABS: 15.0000 mg | ORAL_TABLET | Freq: Every day | ORAL | 1 refills | Status: DC

## 2021-08-28 MED ORDER — PREGABALIN 75 MG PO CAPS
75.0000 mg | ORAL_CAPSULE | Freq: Three times a day (TID) | ORAL | 3 refills | Status: DC
Start: 2021-08-28 — End: 2022-05-20

## 2021-08-28 NOTE — Telephone Encounter (Signed)
Patient requesting refill of meloxicam and lyrica sent to [facility]   She is currently only taking Lyrica 75mg  TID, not QID.Marland Kitchen. new quantity and dosage needs updated prior to sending.

## 2021-09-15 DIAGNOSIS — M5412 Radiculopathy, cervical region: Secondary | ICD-10-CM | POA: Diagnosis not present

## 2021-10-29 ENCOUNTER — Ambulatory Visit (INDEPENDENT_AMBULATORY_CARE_PROVIDER_SITE_OTHER): Payer: BC Managed Care – PPO | Admitting: Podiatry

## 2021-10-29 ENCOUNTER — Encounter: Payer: Self-pay | Admitting: Podiatry

## 2021-10-29 DIAGNOSIS — E1142 Type 2 diabetes mellitus with diabetic polyneuropathy: Secondary | ICD-10-CM

## 2021-10-29 DIAGNOSIS — L03032 Cellulitis of left toe: Secondary | ICD-10-CM

## 2021-10-29 MED ORDER — NEOMYCIN-POLYMYXIN-HC 1 % OT SOLN: OTIC | 1 refills | Status: DC

## 2021-10-29 MED ORDER — DOXYCYCLINE HYCLATE 100 MG PO TABS: 100.0000 mg | ORAL_TABLET | Freq: Two times a day (BID) | ORAL | 0 refills | Status: DC

## 2021-10-29 NOTE — Patient Instructions (Signed)

## 2021-11-01 NOTE — Progress Notes (Addendum)
She presents today chief complaint of a painful ingrown toenail to the lateral border of the third toe of the left foot.  She states has been red and sore for the past 2 weeks. ? ?Objective: Vital signs are stable she alert and oriented x3 pulses are palpable.  There is no change in her neurologic status.  Deep tendon reflexes are intact muscle strength is normal and symmetrical.  She has a sharp incurvated nail margin along the lateral border of the left third toe the left.  With pain on palpation. ? ?Assessment: Diabetic peripheral neuropathy.  Mild paronychia third toe lateral border left. ? ?Plan: Local anesthetic was applied to the third digit of the left foot.  After that prepped and draped in normal sterile fashion.  The lateral margin was removed.  Tolerated procedure well.  Was given both oral and written home-going structure for the care and soaking of the toe.  Follow-up with me in 2 to 3 weeks with questions or concerns. ?

## 2021-11-03 ENCOUNTER — Telehealth: Payer: Self-pay | Admitting: *Deleted

## 2021-11-03 NOTE — Telephone Encounter (Signed)
Patient is wanting to know how many ,days  should she continue to soak her toe in the betadine solution, had her procedure on 10/29/21. ?

## 2021-11-04 DIAGNOSIS — M5412 Radiculopathy, cervical region: Secondary | ICD-10-CM | POA: Diagnosis not present

## 2021-11-04 DIAGNOSIS — Z6829 Body mass index (BMI) 29.0-29.9, adult: Secondary | ICD-10-CM | POA: Diagnosis not present

## 2021-11-07 ENCOUNTER — Other Ambulatory Visit: Payer: Self-pay | Admitting: Podiatry

## 2021-11-12 ENCOUNTER — Ambulatory Visit (INDEPENDENT_AMBULATORY_CARE_PROVIDER_SITE_OTHER): Payer: BC Managed Care – PPO | Admitting: Podiatry

## 2021-11-12 ENCOUNTER — Encounter: Payer: Self-pay | Admitting: Podiatry

## 2021-11-12 DIAGNOSIS — M5412 Radiculopathy, cervical region: Secondary | ICD-10-CM | POA: Insufficient documentation

## 2021-11-12 DIAGNOSIS — E1142 Type 2 diabetes mellitus with diabetic polyneuropathy: Secondary | ICD-10-CM | POA: Insufficient documentation

## 2021-11-12 DIAGNOSIS — E7800 Pure hypercholesterolemia, unspecified: Secondary | ICD-10-CM | POA: Insufficient documentation

## 2021-11-12 DIAGNOSIS — J301 Allergic rhinitis due to pollen: Secondary | ICD-10-CM | POA: Insufficient documentation

## 2021-11-12 DIAGNOSIS — J309 Allergic rhinitis, unspecified: Secondary | ICD-10-CM | POA: Insufficient documentation

## 2021-11-12 DIAGNOSIS — J329 Chronic sinusitis, unspecified: Secondary | ICD-10-CM | POA: Insufficient documentation

## 2021-11-12 DIAGNOSIS — M5126 Other intervertebral disc displacement, lumbar region: Secondary | ICD-10-CM | POA: Insufficient documentation

## 2021-11-12 DIAGNOSIS — G629 Polyneuropathy, unspecified: Secondary | ICD-10-CM | POA: Insufficient documentation

## 2021-11-12 DIAGNOSIS — Z9889 Other specified postprocedural states: Secondary | ICD-10-CM

## 2021-11-12 DIAGNOSIS — I10 Essential (primary) hypertension: Secondary | ICD-10-CM | POA: Insufficient documentation

## 2021-11-12 DIAGNOSIS — L03032 Cellulitis of left toe: Secondary | ICD-10-CM

## 2021-11-12 DIAGNOSIS — E119 Type 2 diabetes mellitus without complications: Secondary | ICD-10-CM | POA: Insufficient documentation

## 2021-11-12 DIAGNOSIS — E1165 Type 2 diabetes mellitus with hyperglycemia: Secondary | ICD-10-CM | POA: Insufficient documentation

## 2021-11-12 DIAGNOSIS — F1990 Other psychoactive substance use, unspecified, uncomplicated: Secondary | ICD-10-CM | POA: Insufficient documentation

## 2021-11-12 DIAGNOSIS — Z79899 Other long term (current) drug therapy: Secondary | ICD-10-CM | POA: Insufficient documentation

## 2021-11-12 DIAGNOSIS — E559 Vitamin D deficiency, unspecified: Secondary | ICD-10-CM | POA: Insufficient documentation

## 2021-11-12 NOTE — Progress Notes (Signed)
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  Denies fever chills nausea vomiting muscle aches pains calf pain back pain chest pain shortness of breath.  States that she continues to soak in Epsom salts and Betadine with warm water. ? ?Objective: Toenail has gone on to heal uneventfully there is no erythema edema cellulitis drainage or odor. ? ?Assessment: Well-healing surgical toe. ? ?Plan: Follow-up with me on an as-needed basis ?

## 2021-11-13 DIAGNOSIS — L409 Psoriasis, unspecified: Secondary | ICD-10-CM | POA: Diagnosis not present

## 2021-11-13 DIAGNOSIS — E119 Type 2 diabetes mellitus without complications: Secondary | ICD-10-CM | POA: Diagnosis not present

## 2021-11-17 DIAGNOSIS — E119 Type 2 diabetes mellitus without complications: Secondary | ICD-10-CM | POA: Diagnosis not present

## 2021-11-24 ENCOUNTER — Ambulatory Visit: Payer: BC Managed Care – PPO | Admitting: Podiatry

## 2021-11-26 ENCOUNTER — Ambulatory Visit: Payer: BC Managed Care – PPO | Admitting: Podiatry

## 2021-12-02 DIAGNOSIS — L4050 Arthropathic psoriasis, unspecified: Secondary | ICD-10-CM | POA: Diagnosis not present

## 2021-12-02 DIAGNOSIS — M25531 Pain in right wrist: Secondary | ICD-10-CM | POA: Diagnosis not present

## 2021-12-02 DIAGNOSIS — M79644 Pain in right finger(s): Secondary | ICD-10-CM | POA: Diagnosis not present

## 2021-12-28 DIAGNOSIS — M25531 Pain in right wrist: Secondary | ICD-10-CM | POA: Diagnosis not present

## 2021-12-31 ENCOUNTER — Other Ambulatory Visit: Payer: Self-pay | Admitting: Orthopedic Surgery

## 2021-12-31 DIAGNOSIS — M24131 Other articular cartilage disorders, right wrist: Secondary | ICD-10-CM

## 2022-01-18 ENCOUNTER — Ambulatory Visit
Admission: RE | Admit: 2022-01-18 | Discharge: 2022-01-18 | Disposition: A | Discharge status: Home / Self Care | Payer: BC Managed Care – PPO | Source: Ambulatory Visit | Attending: Orthopedic Surgery | Admitting: Orthopedic Surgery

## 2022-01-18 DIAGNOSIS — M24131 Other articular cartilage disorders, right wrist: Secondary | ICD-10-CM

## 2022-01-18 DIAGNOSIS — M19031 Primary osteoarthritis, right wrist: Secondary | ICD-10-CM | POA: Diagnosis not present

## 2022-01-18 MED ORDER — IOPAMIDOL (ISOVUE-M 200) INJECTION 41%
2.0000 mL | Freq: Once | INTRAMUSCULAR | Status: AC
  Administered 2022-01-18: 2 mL via INTRA_ARTICULAR

## 2022-01-27 DIAGNOSIS — E119 Type 2 diabetes mellitus without complications: Secondary | ICD-10-CM | POA: Diagnosis not present

## 2022-01-27 DIAGNOSIS — E7800 Pure hypercholesterolemia, unspecified: Secondary | ICD-10-CM | POA: Diagnosis not present

## 2022-01-28 DIAGNOSIS — M19031 Primary osteoarthritis, right wrist: Secondary | ICD-10-CM | POA: Diagnosis not present

## 2022-02-20 ENCOUNTER — Other Ambulatory Visit: Payer: Self-pay | Admitting: Podiatry

## 2022-03-01 DIAGNOSIS — M24131 Other articular cartilage disorders, right wrist: Secondary | ICD-10-CM | POA: Diagnosis not present

## 2022-03-01 DIAGNOSIS — M25531 Pain in right wrist: Secondary | ICD-10-CM | POA: Diagnosis not present

## 2022-03-05 IMAGING — MR MR CERVICAL SPINE W/O CM
4 of 5 series · 26 of 48 positions shown · non-contrast
Comparison: No pertinent prior exams available for comparison.

CLINICAL DATA: Cervical radiculopathy. Additional history provided
by scanning technologist: Patient reports neck pain with tingling in
left arm for 2-3 weeks, constant numbness in left thumb and index
finger.

EXAM:
MRI CERVICAL SPINE WITHOUT CONTRAST
TECHNIQUE: Multiplanar, multisequence MR imaging of the cervical spine was
performed. No intravenous contrast was administered.

[Series 9: T2 · sagittal · 3.0mm · 0.55mm/px · 6 of 15 slices shown (1 of 2)]
[im 1/15]
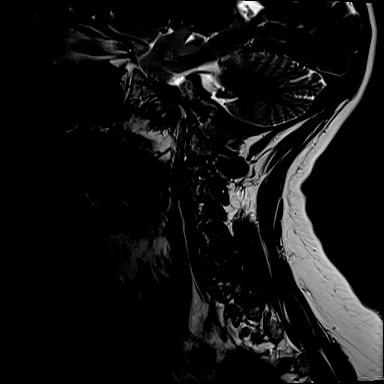
[im 3/15]
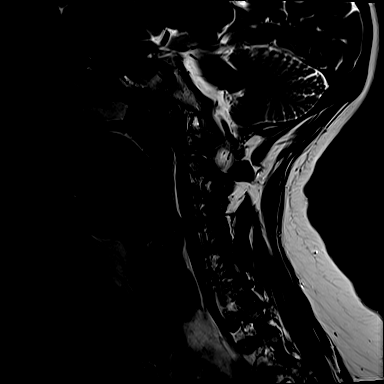
[im 6/15]
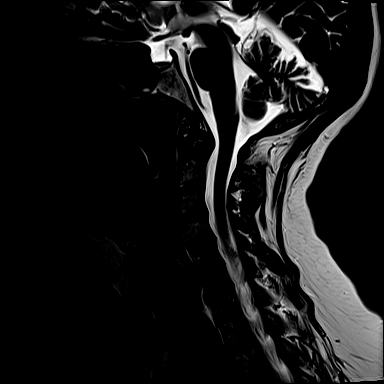
[im 9/15]
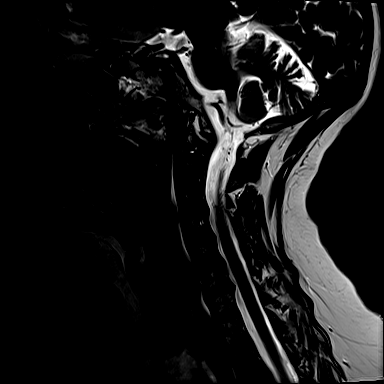
[im 12/15]
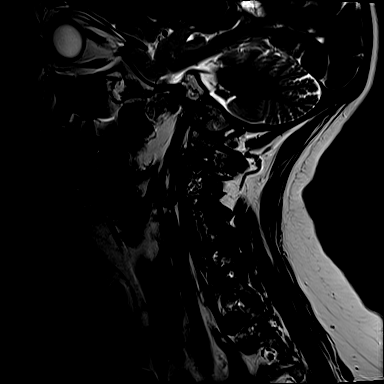
[im 15/15]
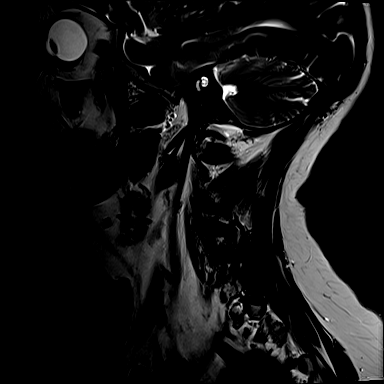

[Series 10: T1 · sagittal · 3.0mm · 0.66mm/px · 7 of 15 slices shown]
[im 1/15]
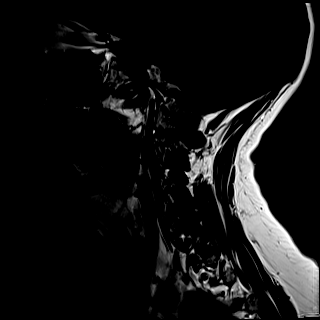
[im 3/15]
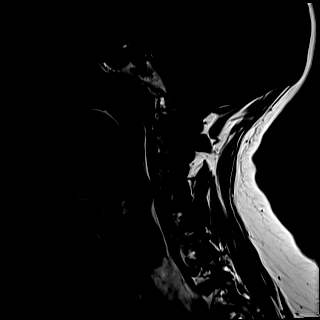
[im 5/15]
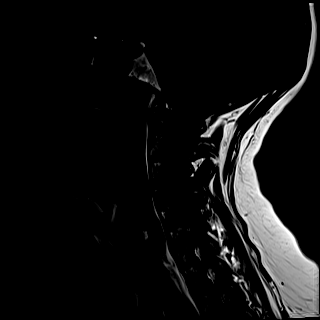
[im 8/15]
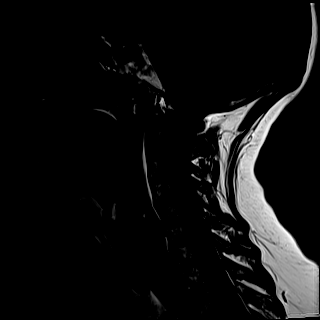
[im 10/15]
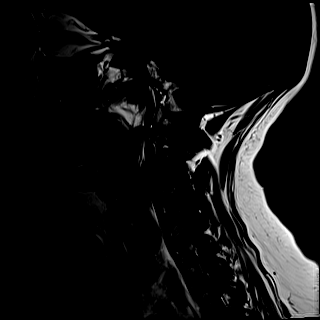
[im 12/15]
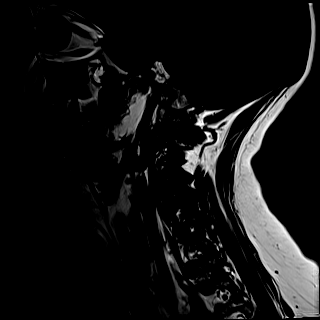
[im 15/15]
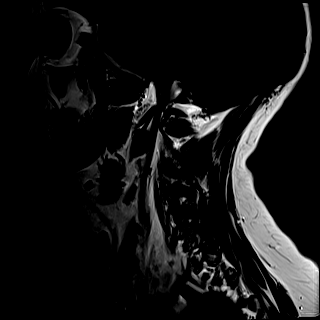

[Series 11: STIR · sagittal · 3.0mm · 0.33mm/px · 5 of 15 slices shown]
[im 1/15]
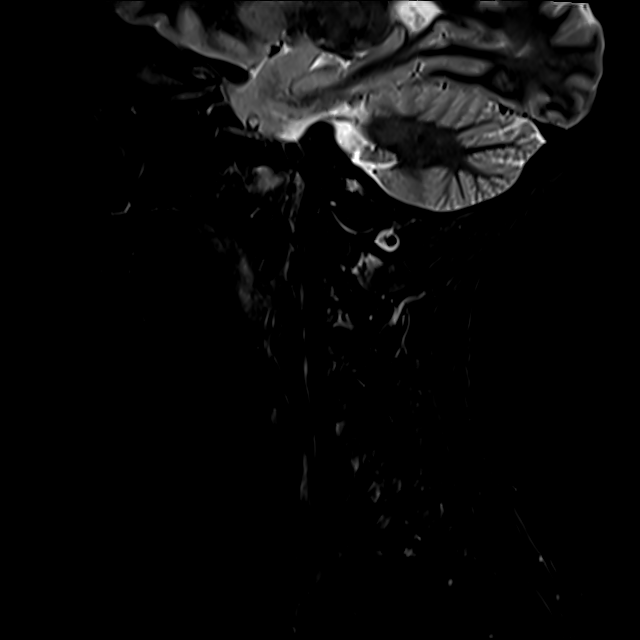
[im 3/15]
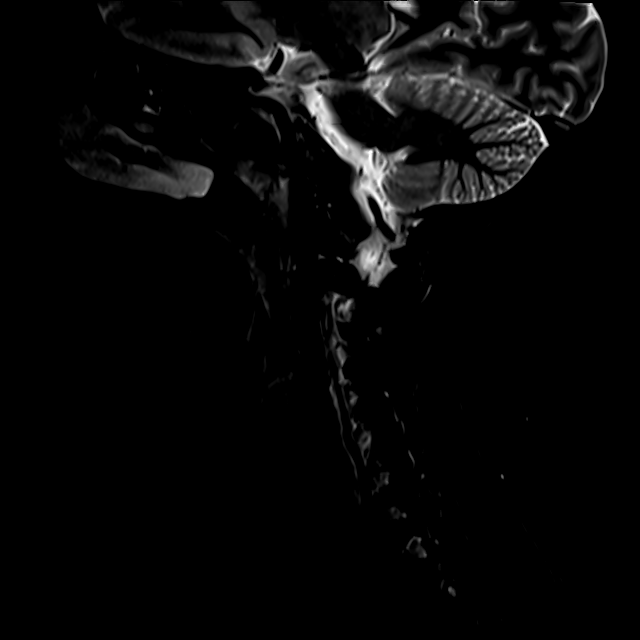
[im 5/15]
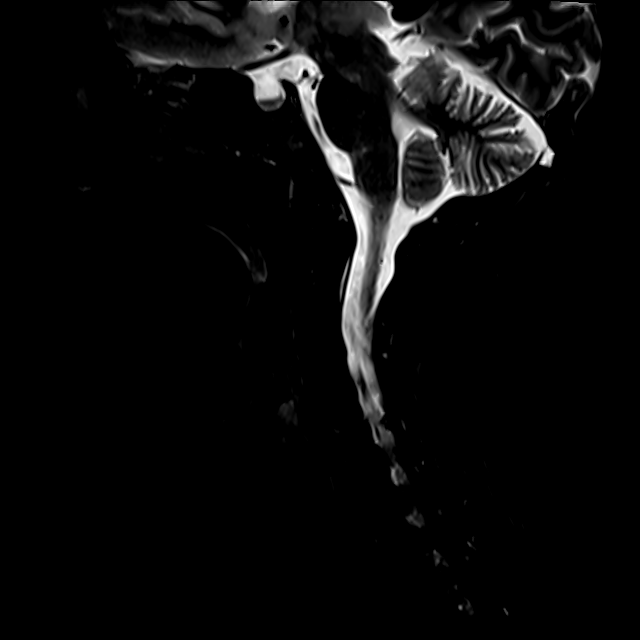
[im 8/15]
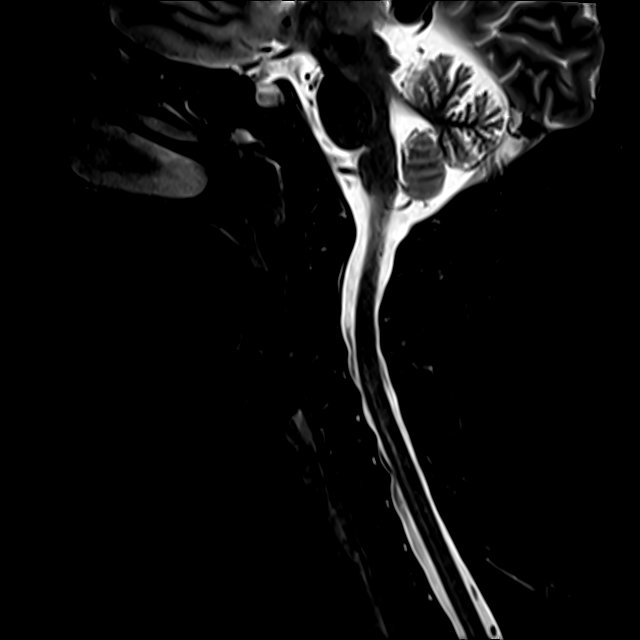
[im 12/15]
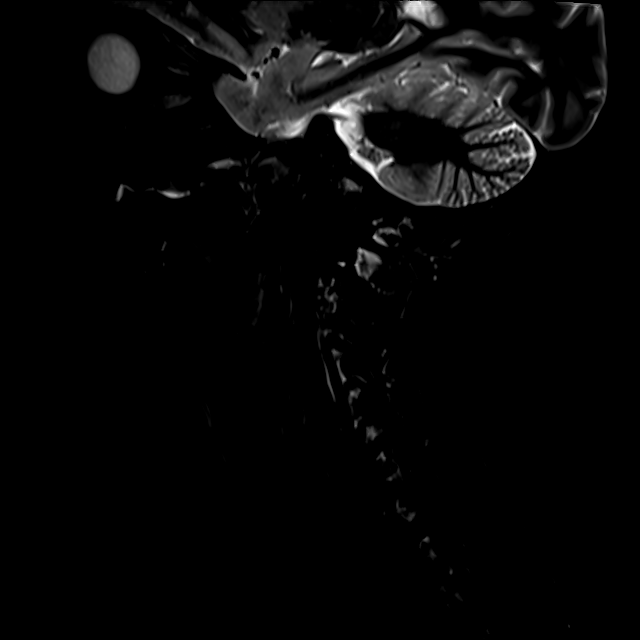

[Series 12: T2 · axial · 3.0mm · 0.50mm/px · z∈[-231,-139]mm · 8 of 30 slices shown (2 of 2)]
[im 1/30]
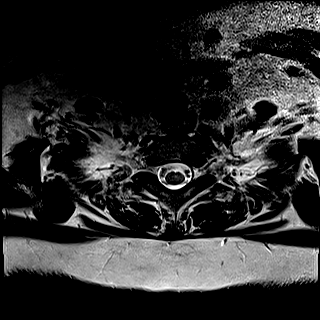
[im 5/30]
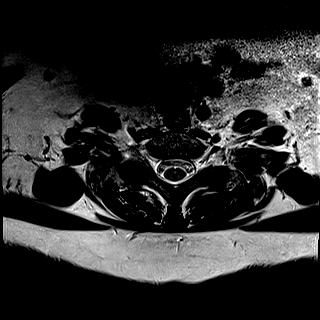
[im 9/30]
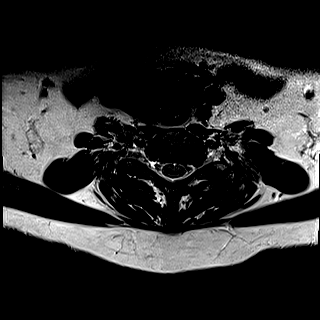
[im 14/30]
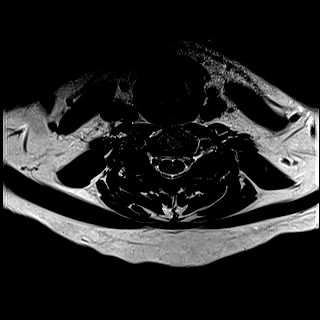
[im 16/30]
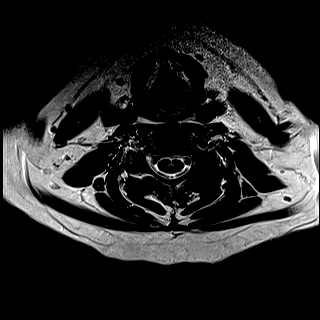
[im 21/30]
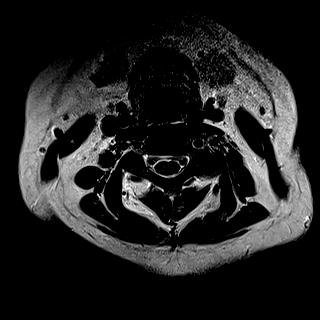
[im 25/30]
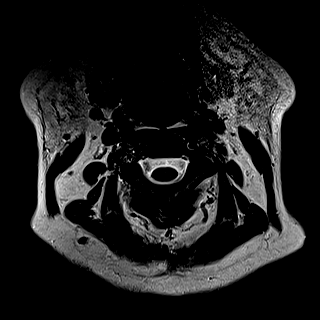
[im 30/30]
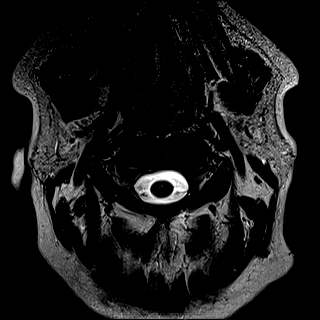

[26 of 48 positions shown; findings below may reference images not displayed]

FINDINGS: Alignment: Mild cervical levocurvature. Straightening of the
expected cervical lordosis. No significant spondylolisthesis.

Vertebrae: Vertebral body height is maintained. Mild multifocal
edema within the posterior elements, likely degenerative and related
to facet arthrosis. Elsewhere, no significant marrow edema or focal
suspicious osseous lesion is identified.

Cord: No signal abnormality identified within the cervical spinal
cord.

Posterior Fossa, vertebral arteries, paraspinal tissues: No
abnormality identified within included portions of the posterior
fossa. Mild mucosal thickening within the bilateral sphenoid
sinuses. Flow voids preserved within the imaged cervical vertebral
arteries. Paraspinal soft tissues unremarkable.

Disc levels:

Mild multilevel disc degeneration, greatest at C4-C5, C5-C6 and
C6-C7.

C2-C3: Facet arthrosis on the left. No significant disc herniation
or stenosis.

C3-C4: Shallow disc bulge. Left-sided disc osteophyte ridge/uncinate
hypertrophy. Advanced facet arthrosis on the left. No significant
spinal canal stenosis. Moderate left neural foraminal narrowing.

C4-C5: Disc bulge. Facet arthrosis (greater on the left and fairly
advanced on the left). Minimal partial effacement of the ventral
thecal sac (without significant spinal cord mass effect). No
significant foraminal stenosis.

C5-C6: Disc bulge. Superimposed shallow broad-based central disc
protrusion. Facet arthrosis (greater on the left). Mild partial
effacement of the ventral thecal sac (without significant spinal
cord mass effect). No significant foraminal stenosis.

C6-C7: Disc bulge. Superimposed 4 mm left foraminal disc protrusion.
Associated left-sided uncovertebral hypertrophy. Right-sided disc
osteophyte ridge/uncinate hypertrophy also present. Facet arthrosis.
Mild partial effacement of ventral thecal sac (without significant
spinal cord mass effect). The left foraminal disc protrusion
contributes to at least moderate left neural foraminal narrowing,
and likely contacts the exiting left C7 nerve root within the left
foraminal entry zone. Mild right neural foraminal narrowing.

C7-T1: Facet arthrosis.  No significant disc herniation or stenosis.
IMPRESSION: Cervical spondylosis, as outlined and with findings most notably as
follows.

At C6-C7, there is a disc bulge. Superimposed 4 mm left foraminal
disc protrusion. Associated left-sided uncovertebral hypertrophy.
Right-sided disc osteophyte ridge/uncinate hypertrophy also present.
Facet arthrosis. The disc protrusion contributes to at least
moderate left neural foraminal narrowing, and likely contacts the
exiting left C7 nerve root within the left foraminal entry zone.
Correlate for left C7 radiculopathy. Mild relative spinal canal and
mild right neural foraminal narrowing.

At C3-C4, left-sided disc osteophyte ridge/uncinate hypertrophy and
advanced facet arthrosis contribute to moderate left neural
foraminal narrowing.

No more than mild relative spinal canal narrowing at the remaining
levels (without spinal cord mass effect). No significant foraminal
stenosis at the remaining levels.

Straightening of the expected cervical lordosis.

Mild cervical levocurvature.

## 2022-03-24 DIAGNOSIS — K1121 Acute sialoadenitis: Secondary | ICD-10-CM | POA: Diagnosis not present

## 2022-03-24 DIAGNOSIS — N6320 Unspecified lump in the left breast, unspecified quadrant: Secondary | ICD-10-CM | POA: Diagnosis not present

## 2022-03-30 ENCOUNTER — Other Ambulatory Visit: Payer: Self-pay | Admitting: Family Medicine

## 2022-03-30 DIAGNOSIS — N6320 Unspecified lump in the left breast, unspecified quadrant: Secondary | ICD-10-CM

## 2022-04-13 ENCOUNTER — Ambulatory Visit
Admission: RE | Admit: 2022-04-13 | Discharge: 2022-04-13 | Disposition: A | Discharge status: Home / Self Care | Payer: BC Managed Care – PPO | Source: Ambulatory Visit | Attending: Family Medicine | Admitting: Family Medicine

## 2022-04-13 DIAGNOSIS — N6323 Unspecified lump in the left breast, lower outer quadrant: Secondary | ICD-10-CM | POA: Diagnosis not present

## 2022-04-13 DIAGNOSIS — N6324 Unspecified lump in the left breast, lower inner quadrant: Secondary | ICD-10-CM | POA: Diagnosis not present

## 2022-04-13 DIAGNOSIS — R928 Other abnormal and inconclusive findings on diagnostic imaging of breast: Secondary | ICD-10-CM | POA: Diagnosis not present

## 2022-04-13 DIAGNOSIS — N6320 Unspecified lump in the left breast, unspecified quadrant: Secondary | ICD-10-CM

## 2022-04-26 DIAGNOSIS — N6082 Other benign mammary dysplasias of left breast: Secondary | ICD-10-CM | POA: Diagnosis not present

## 2022-05-06 ENCOUNTER — Other Ambulatory Visit: Payer: Self-pay | Admitting: Family Medicine

## 2022-05-06 ENCOUNTER — Ambulatory Visit
Admission: RE | Admit: 2022-05-06 | Discharge: 2022-05-06 | Disposition: A | Discharge status: Home / Self Care | Payer: BC Managed Care – PPO | Source: Ambulatory Visit | Attending: Family Medicine | Admitting: Family Medicine

## 2022-05-06 DIAGNOSIS — R52 Pain, unspecified: Secondary | ICD-10-CM

## 2022-05-06 DIAGNOSIS — M549 Dorsalgia, unspecified: Secondary | ICD-10-CM | POA: Diagnosis not present

## 2022-05-06 DIAGNOSIS — R0781 Pleurodynia: Secondary | ICD-10-CM | POA: Diagnosis not present

## 2022-05-20 ENCOUNTER — Ambulatory Visit (INDEPENDENT_AMBULATORY_CARE_PROVIDER_SITE_OTHER): Payer: BC Managed Care – PPO | Admitting: Podiatry

## 2022-05-20 DIAGNOSIS — E1142 Type 2 diabetes mellitus with diabetic polyneuropathy: Secondary | ICD-10-CM

## 2022-05-20 MED ORDER — PREGABALIN 75 MG PO CAPS: 75.0000 mg | ORAL_CAPSULE | Freq: Three times a day (TID) | ORAL | 3 refills | Status: DC

## 2022-05-20 MED ORDER — CYCLOBENZAPRINE HCL 10 MG PO TABS: 10.0000 mg | ORAL_TABLET | Freq: Three times a day (TID) | ORAL | 1 refills | Status: DC | PRN

## 2022-05-20 MED ORDER — MELOXICAM 15 MG PO TABS: 15.0000 mg | ORAL_TABLET | Freq: Every day | ORAL | 3 refills | Status: DC

## 2022-05-20 NOTE — Progress Notes (Signed)
She presents today for her 64-month diabetic checkup and refill her meds.  She states that she is doing well for the most part still getting a lot of cramping at night in the right leg primarily.  Objective: Vital signs stable alert oriented x3 pulses are palpable.  Leg feels normal symmetrical bilateral.  Deep tendon reflexes are intact muscle strength is normal and symmetrical bilateral.  Neurologic sensorium is intact per Semmes Weinstein monofilament.  All joints distal to ankle full range of motion without crepitation.  Cutaneous evaluation shows no erythema edema cellulitis drainage or odor.  Assessment: Diabetes mellitus with diabetic peripheral neuropathy.  Plan: "Prescription for cyclobenzaprine Lyrica and meloxicam.  Follow-up with her in 6 months

## 2022-05-25 ENCOUNTER — Other Ambulatory Visit: Payer: Self-pay | Admitting: Surgery

## 2022-05-25 DIAGNOSIS — L989 Disorder of the skin and subcutaneous tissue, unspecified: Secondary | ICD-10-CM | POA: Diagnosis not present

## 2022-05-25 DIAGNOSIS — L723 Sebaceous cyst: Secondary | ICD-10-CM | POA: Diagnosis not present

## 2022-05-29 ENCOUNTER — Encounter: Payer: Self-pay | Admitting: Surgery

## 2022-06-01 ENCOUNTER — Telehealth: Payer: Self-pay

## 2022-06-01 NOTE — Telephone Encounter (Signed)
Done, thank you!

## 2022-06-18 DIAGNOSIS — Z9889 Other specified postprocedural states: Secondary | ICD-10-CM | POA: Diagnosis not present

## 2022-06-29 DIAGNOSIS — L0291 Cutaneous abscess, unspecified: Secondary | ICD-10-CM | POA: Diagnosis not present

## 2022-06-29 DIAGNOSIS — R233 Spontaneous ecchymoses: Secondary | ICD-10-CM | POA: Diagnosis not present

## 2022-08-10 DIAGNOSIS — T148XXA Other injury of unspecified body region, initial encounter: Secondary | ICD-10-CM | POA: Diagnosis not present

## 2022-08-10 DIAGNOSIS — Z79899 Other long term (current) drug therapy: Secondary | ICD-10-CM | POA: Diagnosis not present

## 2022-08-10 DIAGNOSIS — Z23 Encounter for immunization: Secondary | ICD-10-CM | POA: Diagnosis not present

## 2022-08-10 DIAGNOSIS — E559 Vitamin D deficiency, unspecified: Secondary | ICD-10-CM | POA: Diagnosis not present

## 2022-08-10 DIAGNOSIS — E119 Type 2 diabetes mellitus without complications: Secondary | ICD-10-CM | POA: Diagnosis not present

## 2022-08-10 DIAGNOSIS — Z0000 Encounter for general adult medical examination without abnormal findings: Secondary | ICD-10-CM | POA: Diagnosis not present

## 2022-08-10 DIAGNOSIS — I10 Essential (primary) hypertension: Secondary | ICD-10-CM | POA: Diagnosis not present

## 2022-09-07 DIAGNOSIS — M24131 Other articular cartilage disorders, right wrist: Secondary | ICD-10-CM | POA: Diagnosis not present

## 2022-09-10 DIAGNOSIS — R140 Abdominal distension (gaseous): Secondary | ICD-10-CM | POA: Diagnosis not present

## 2022-09-23 ENCOUNTER — Encounter: Payer: Self-pay | Admitting: Podiatry

## 2022-10-01 DIAGNOSIS — K1121 Acute sialoadenitis: Secondary | ICD-10-CM | POA: Diagnosis not present

## 2022-10-13 ENCOUNTER — Other Ambulatory Visit: Payer: Self-pay | Admitting: Podiatry

## 2022-10-13 DIAGNOSIS — M549 Dorsalgia, unspecified: Secondary | ICD-10-CM | POA: Diagnosis not present

## 2022-11-18 ENCOUNTER — Ambulatory Visit (INDEPENDENT_AMBULATORY_CARE_PROVIDER_SITE_OTHER): Payer: BC Managed Care – PPO | Admitting: Podiatry

## 2022-11-18 ENCOUNTER — Encounter: Payer: Self-pay | Admitting: Podiatry

## 2022-11-18 DIAGNOSIS — E1142 Type 2 diabetes mellitus with diabetic polyneuropathy: Secondary | ICD-10-CM

## 2022-11-18 MED ORDER — MELOXICAM 15 MG PO TABS: 15.0000 mg | ORAL_TABLET | Freq: Every day | ORAL | 3 refills | Status: DC

## 2022-11-18 MED ORDER — PREGABALIN 75 MG PO CAPS: 75.0000 mg | ORAL_CAPSULE | Freq: Every day | ORAL | 3 refills | Status: DC

## 2022-11-18 MED ORDER — CYCLOBENZAPRINE HCL 10 MG PO TABS: ORAL_TABLET | ORAL | 1 refills | Status: DC

## 2022-11-18 NOTE — Progress Notes (Signed)
She presents today for follow-up of her diabetes and her diabetic medication consisting of Lyrica which she is taking only 1 today she is also taking meloxicam for the arthritis and capsulitis as well as cyclobenzaprine for spasms.  Objective: Vital signs stable alert oriented x 3 no change in physical exam.  Pulses are palpable neurologic sensorium is intact deep tendon reflexes are intact muscle strength is normal and symmetrical bilateral.  No orthopedic issues.  Assessment diabetes mellitus with a history of diabetic peripheral neuropathy.  Plan: She will continue her meloxicam 75 mg of Lyrica once a day and cyclobenzaprine as needed for spasms.  Follow-up with her in 6 months call sooner with questions or concerns.

## 2022-12-21 DIAGNOSIS — E119 Type 2 diabetes mellitus without complications: Secondary | ICD-10-CM | POA: Diagnosis not present

## 2022-12-25 ENCOUNTER — Encounter: Payer: Self-pay | Admitting: Emergency Medicine

## 2022-12-25 ENCOUNTER — Other Ambulatory Visit: Payer: Self-pay

## 2022-12-25 ENCOUNTER — Ambulatory Visit
Admission: EM | Admit: 2022-12-25 | Discharge: 2022-12-25 | Disposition: A | Discharge status: Home / Self Care | Payer: BC Managed Care – PPO | Attending: Family Medicine | Admitting: Family Medicine

## 2022-12-25 DIAGNOSIS — H60392 Other infective otitis externa, left ear: Secondary | ICD-10-CM

## 2022-12-25 MED ORDER — CIPROFLOXACIN-DEXAMETHASONE 0.3-0.1 % OT SUSP: 3.0000 [drp] | Freq: Three times a day (TID) | OTIC | 0 refills | Status: AC

## 2022-12-25 NOTE — Discharge Instructions (Addendum)
Complete treatment for a total of 7 days.  If your symptoms worsen or do not readily improve return for evaluation.

## 2022-12-25 NOTE — ED Provider Notes (Signed)
[facility]    CSN: [id number] Arrival date & time: 12/25/22  1021      History   Chief Complaint Chief Complaint  Patient presents with   Otalgia    HPI Christina Howard is a 57 y.o. female.   HPI Patient presents for evaluation of left ear pain x 2 days. She denies any associated URI symptoms. She denies any recent swimming although is uncertain if water may have entered the ear while washing her hair. Characterizes ear pain as sharp and sensation of fullness. Denies ear drainage or loss of hearing.  Past Medical History:  Diagnosis Date   Anemia    Arthritis    Complication of anesthesia    pt states she had hot flashes for a week after surgery   Contact lens/glasses fitting    wears contacts or glasses   Diabetes mellitus    Gallstones    Hyperlipidemia    Hypertension    Kidney stone    Menorrhagia 07/20/2011   Obesity (BMI 30-39.9)     Patient Active Problem List   Diagnosis Date Noted   Allergic rhinitis 11/12/2021   Cervical radiculopathy 11/12/2021   Chronic sinusitis 11/12/2021   Current drug use 11/12/2021   Diabetic peripheral neuropathy associated with type 2 diabetes mellitus (HCC) 11/12/2021   Disc displacement, lumbar 11/12/2021   Essential hypertension 11/12/2021   Hay fever 11/12/2021   Hyperglycemia due to type 2 diabetes mellitus (HCC) 11/12/2021   Other long term (current) drug therapy 11/12/2021   Peripheral neuropathy 11/12/2021   Pure hypercholesterolemia 11/12/2021   Type 2 diabetes mellitus without complications (HCC) 11/12/2021   Vitamin D deficiency 11/12/2021   Extremity pain 10/15/2013    Past Surgical History:  Procedure Laterality Date   ABLATION ON ENDOMETRIOSIS  2011   barthalmus glands removed  1989   BREAST CYST EXCISION Left 11/20/2013   Procedure: CYST EXCISION BREAST;  Surgeon: Maisie Fus A. Cornett, MD;  Location: [facility];  Service: General;  Laterality: Left;   CHOLECYSTECTOMY  05/31/2011    Procedure: LAPAROSCOPIC CHOLECYSTECTOMY WITH INTRAOPERATIVE CHOLANGIOGRAM;  Surgeon: [name], MD;  Location: [facility];  Service: General;  Laterality: N/A;   ENDOMETRIAL ABLATION     knee scopes  2003   rt and lt   LAPAROSCOPIC ASSISTED VAGINAL HYSTERECTOMY  07/19/2011   Procedure: LAPAROSCOPIC ASSISTED VAGINAL HYSTERECTOMY;  Surgeon: Oliver Pila;  Location: [facility];  Service: Gynecology;  Laterality: N/A;   NASAL SINUS SURGERY  2002   RECTOCELE REPAIR  07/19/2011   Procedure: POSTERIOR REPAIR (RECTOCELE);  Surgeon: Oliver Pila;  Location: [facility];  Service: Gynecology;  Laterality: N/A;    OB History   No obstetric history on file.      Home Medications    Prior to Admission medications   Medication Sig Start Date End Date Taking? Authorizing Provider  ciprofloxacin-dexamethasone (CIPRODEX) OTIC suspension Place 3 drops into the left ear 3 (three) times daily for 7 days. 12/25/22 01/01/23 Yes Bing Neighbors, NP  atorvastatin (LIPITOR) 10 MG tablet Take 10 mg by mouth at bedtime.     [provider]  Cholecalciferol (VITAMIN D3) 2000 UNITS capsule Take 2,000 Units by mouth every morning.      [provider]  cyclobenzaprine (FLEXERIL) 10 MG tablet TAKE 1 TABLET BY MOUTH 3 TIMES  DAILY AS NEEDED FOR MUSCLE  SPASM(S) 11/18/22   Hyatt, Max T, DPM  diphenhydrAMINE (BENADRYL) 25 mg capsule Take 50 mg  by mouth at bedtime.     [provider]  FIBER PO Take by mouth.    [provider]  lisinopril-hydrochlorothiazide (PRINZIDE,ZESTORETIC) 20-25 MG per tablet Take 1 tablet by mouth daily.    [provider]  meloxicam (MOBIC) 15 MG tablet Take 1 tablet (15 mg total) by mouth daily. 11/18/22   Hyatt, Max T, DPM  metoprolol (TOPROL-XL) 50 MG 24 hr tablet Take 50 mg by mouth at bedtime.     [provider]  MOUNJARO 7.5 MG/0.5ML Pen 0.89ml Subcutaneous Once a week for 30 days 04/25/22   [provider]  Multiple Vitamin  (MULTIVITAMIN) capsule Take 1 capsule by mouth daily.    [provider]  pregabalin (LYRICA) 75 MG capsule Take 1 capsule (75 mg total) by mouth daily. 11/18/22   Hyatt, Max T, DPM    Family History Family History  Problem Relation Age of Onset   Hypertension Mother    Hyperlipidemia Mother    Diabetes Father    Hyperlipidemia Father    Hypertension Father    Breast cancer Neg Hx     Social History Social History   Tobacco Use   Smoking status: Never   Smokeless tobacco: Never  Substance Use Topics   Alcohol use: Yes    Comment: very rarely   Drug use: No     Allergies   Amoxicillin, Banana (diagnostic), Oxycodone hcl, Oxycodone hcl, Penicillins, Sulfa antibiotics, and Sulfa drugs cross reactors   Review of Systems Review of Systems Pertinent negatives listed in HPI   Physical Exam Triage Vital Signs ED Triage Vitals [12/25/22 1211]  Enc Vitals Group     BP (!) 148/79     Pulse Rate 63     Resp 18     Temp 98.1 F (36.7 C)     Temp Source Oral     SpO2 95 %     Weight      Height      Head Circumference      Peak Flow      Pain Score 4     Pain Loc      Pain Edu?      Excl. in GC?    No data found.  Updated Vital Signs BP (!) 148/79 (BP Location: Left Arm)   Pulse 63   Temp 98.1 F (36.7 C) (Oral)   Resp 18   LMP 07/07/2011   SpO2 95%   Visual Acuity Right Eye Distance:   Left Eye Distance:   Bilateral Distance:    Right Eye Near:   Left Eye Near:    Bilateral Near:     Physical Exam Vitals reviewed.  Constitutional:      Appearance: Normal appearance.  HENT:     Head: Normocephalic and atraumatic.     Left Ear: No decreased hearing noted. Swelling and tenderness present. No drainage. A middle ear effusion is present. Tympanic membrane is erythematous. Tympanic membrane is not injected or bulging.     Nose: Nose normal.  Eyes:     Extraocular Movements: Extraocular movements intact.     Pupils: Pupils are equal, round, and  reactive to light.  Cardiovascular:     Rate and Rhythm: Normal rate and regular rhythm.  Pulmonary:     Effort: Pulmonary effort is normal.     Breath sounds: Normal breath sounds.  Neurological:     General: No focal deficit present.     Mental Status: She is alert.  UC Treatments / Results  Labs (all labs ordered are listed, but only abnormal results are displayed) Labs Reviewed - No data to display  EKG   Radiology No results found.  Procedures Procedures (including critical care time)  Medications Ordered in UC Medications - No data to display  Initial Impression / Assessment and Plan / UC Course  I have reviewed the triage vital signs and the nursing notes.  Pertinent labs & imaging results that were available during my care of the patient were reviewed by me and considered in my medical decision making (see chart for details).    Infective otitis media, Cortisporin 3 drops x 3 times daily x 7 days. Tylenol as needed for pain. Follow-up with PCP as needed. Final Clinical Impressions(s) / UC Diagnoses   Final diagnoses:  Infective otitis externa of left ear     Discharge Instructions      Complete treatment for a total of 7 days.  If your symptoms worsen or do not readily improve return for evaluation.     ED Prescriptions     Medication Sig Dispense Auth. Provider   ciprofloxacin-dexamethasone (CIPRODEX) OTIC suspension Place 3 drops into the left ear 3 (three) times daily for 7 days. 3.2 mL Bing Neighbors, NP      PDMP not reviewed this encounter.   Bing Neighbors, NP 12/27/22 2329

## 2022-12-25 NOTE — ED Triage Notes (Signed)
Pt here for left ear pain x 2 days 

## 2023-01-03 ENCOUNTER — Encounter: Payer: Self-pay | Admitting: *Deleted

## 2023-01-03 ENCOUNTER — Ambulatory Visit
Admission: EM | Admit: 2023-01-03 | Discharge: 2023-01-03 | Disposition: A | Discharge status: Home / Self Care | Payer: BC Managed Care – PPO | Attending: Internal Medicine | Admitting: Internal Medicine

## 2023-01-03 DIAGNOSIS — N3001 Acute cystitis with hematuria: Secondary | ICD-10-CM | POA: Diagnosis not present

## 2023-01-03 DIAGNOSIS — E1169 Type 2 diabetes mellitus with other specified complication: Secondary | ICD-10-CM

## 2023-01-03 LAB — POCT URINALYSIS DIP (MANUAL ENTRY)
Bilirubin, UA: NEGATIVE
Glucose, UA: NEGATIVE mg/dL
Ketones, POC UA: NEGATIVE mg/dL
Nitrite, UA: NEGATIVE
Protein Ur, POC: 30 mg/dL — AB
Spec Grav, UA: 1.015 (ref 1.010–1.025)
Urobilinogen, UA: 0.2 E.U./dL
pH, UA: 7 (ref 5.0–8.0)

## 2023-01-03 MED ORDER — CEPHALEXIN 500 MG PO CAPS: 500.0000 mg | ORAL_CAPSULE | Freq: Two times a day (BID) | ORAL | 0 refills | Status: AC

## 2023-01-03 NOTE — ED Provider Notes (Signed)
[facility]    CSN: [id number] Arrival date & time: 01/03/23  4098      History   Chief Complaint Chief Complaint  Patient presents with   Dysuria    HPI Christina Howard is a 57 y.o. female.   Patient presents to urgent care for evaluation of urinary frequency, dysuria, urinary urgency, and "pink to urine when wiping" that started overnight last night. Had frequency overnight last night and this is abnormal for her. She is a diabetic and her sugars have been stable. Fasting CBG this morning was 104. Diabetes is controlled with Monjaro injection. No fever/chills, nausea, vomiting, diarrhea, abdominal pain, flank pain, low back pain, or dizziness. Denies recent UTI and states she does not have UTIs frequently. Denies recent antibiotic/steroid use. Denies use of any OTC medications.    Dysuria   Past Medical History:  Diagnosis Date   Anemia    Arthritis    Complication of anesthesia    pt states she had hot flashes for a week after surgery   Contact lens/glasses fitting    wears contacts or glasses   Diabetes mellitus    Gallstones    Hyperlipidemia    Hypertension    Kidney stone    Menorrhagia 07/20/2011   Obesity (BMI 30-39.9)     Patient Active Problem List   Diagnosis Date Noted   Allergic rhinitis 11/12/2021   Cervical radiculopathy 11/12/2021   Chronic sinusitis 11/12/2021   Current drug use 11/12/2021   Diabetic peripheral neuropathy associated with type 2 diabetes mellitus (HCC) 11/12/2021   Disc displacement, lumbar 11/12/2021   Essential hypertension 11/12/2021   Hay fever 11/12/2021   Hyperglycemia due to type 2 diabetes mellitus (HCC) 11/12/2021   Other long term (current) drug therapy 11/12/2021   Peripheral neuropathy 11/12/2021   Pure hypercholesterolemia 11/12/2021   Type 2 diabetes mellitus without complications (HCC) 11/12/2021   Vitamin D deficiency 11/12/2021   Extremity pain 10/15/2013    Past Surgical History:  Procedure  Laterality Date   ABLATION ON ENDOMETRIOSIS  2011   barthalmus glands removed  1989   BREAST CYST EXCISION Left 11/20/2013   Procedure: CYST EXCISION BREAST;  Surgeon: Maisie Fus A. Cornett, MD;  Location: [facility];  Service: General;  Laterality: Left;   CHOLECYSTECTOMY  05/31/2011   Procedure: LAPAROSCOPIC CHOLECYSTECTOMY WITH INTRAOPERATIVE CHOLANGIOGRAM;  Surgeon: [name], MD;  Location: [facility];  Service: General;  Laterality: N/A;   ENDOMETRIAL ABLATION     knee scopes  2003   rt and lt   LAPAROSCOPIC ASSISTED VAGINAL HYSTERECTOMY  07/19/2011   Procedure: LAPAROSCOPIC ASSISTED VAGINAL HYSTERECTOMY;  Surgeon: Oliver Pila;  Location: [facility];  Service: Gynecology;  Laterality: N/A;   NASAL SINUS SURGERY  2002   RECTOCELE REPAIR  07/19/2011   Procedure: POSTERIOR REPAIR (RECTOCELE);  Surgeon: Oliver Pila;  Location: [facility];  Service: Gynecology;  Laterality: N/A;    OB History   No obstetric history on file.      Home Medications    Prior to Admission medications   Medication Sig Start Date End Date Taking? Authorizing Provider  atorvastatin (LIPITOR) 10 MG tablet Take 10 mg by mouth at bedtime.    Yes [provider]  cephALEXin (KEFLEX) 500 MG capsule Take 1 capsule (500 mg total) by mouth 2 (two) times daily for 7 days. 01/03/23 01/10/23 Yes Carlisle Beers, FNP  Cholecalciferol (VITAMIN D3) 2000 UNITS capsule Take 2,000 Units by mouth every  morning.     Yes [provider]  cyclobenzaprine (FLEXERIL) 10 MG tablet TAKE 1 TABLET BY MOUTH 3 TIMES  DAILY AS NEEDED FOR MUSCLE  SPASM(S) 11/18/22  Yes Hyatt, Max T, DPM  diphenhydrAMINE (BENADRYL) 25 mg capsule Take 50 mg by mouth at bedtime.    Yes [provider]  FIBER PO Take by mouth.   Yes [provider]  GLUCOSAMINE-CHONDROITIN PO Take by mouth.   Yes [provider]  lisinopril-hydrochlorothiazide (PRINZIDE,ZESTORETIC) 20-25 MG per tablet Take 1  tablet by mouth daily.   Yes [provider]  meloxicam (MOBIC) 15 MG tablet Take 1 tablet (15 mg total) by mouth daily. 11/18/22  Yes Hyatt, Max T, DPM  metoprolol (TOPROL-XL) 50 MG 24 hr tablet Take 50 mg by mouth at bedtime.    Yes [provider]  MOUNJARO 7.5 MG/0.5ML Pen 0.25ml Subcutaneous Once a week for 30 days 04/25/22  Yes [provider]  Multiple Vitamin (MULTIVITAMIN) capsule Take 1 capsule by mouth daily.   Yes [provider]  pregabalin (LYRICA) 75 MG capsule Take 1 capsule (75 mg total) by mouth daily. 11/18/22  Yes Hyatt, Max T, DPM    Family History Family History  Problem Relation Age of Onset   Hypertension Mother    Hyperlipidemia Mother    Diabetes Father    Hyperlipidemia Father    Hypertension Father    Breast cancer Neg Hx     Social History Social History   Tobacco Use   Smoking status: Never   Smokeless tobacco: Never  Vaping Use   Vaping Use: Never used  Substance Use Topics   Alcohol use: Yes    Comment: rarely   Drug use: No     Allergies   Amoxicillin, Banana (diagnostic), Other, Oxycodone hcl, Oxycodone hcl, Penicillins, Sulfa antibiotics, and Sulfa drugs cross reactors   Review of Systems Review of Systems  Genitourinary:  Positive for dysuria.  Per HPI   Physical Exam Triage Vital Signs ED Triage Vitals  Enc Vitals Group     BP 01/03/23 0835 (!) 145/82     Pulse Rate 01/03/23 0835 68     Resp 01/03/23 0835 16     Temp 01/03/23 0835 98.1 F (36.7 C)     Temp Source 01/03/23 0835 Oral     SpO2 01/03/23 0835 99 %     Weight --      Height --      Head Circumference --      Peak Flow --      Pain Score 01/03/23 0836 2     Pain Loc --      Pain Edu? --      Excl. in GC? --    No data found.  Updated Vital Signs BP (!) 145/82   Pulse 68   Temp 98.1 F (36.7 C) (Oral)   Resp 16   LMP 07/07/2011   SpO2 99%   Visual Acuity Right Eye Distance:   Left Eye Distance:   Bilateral  Distance:    Right Eye Near:   Left Eye Near:    Bilateral Near:     Physical Exam Vitals and nursing note reviewed.  Constitutional:      Appearance: She is not ill-appearing or toxic-appearing.  HENT:     Head: Normocephalic and atraumatic.     Right Ear: Hearing and external ear normal.     Left Ear: Hearing and external ear normal.  Nose: Nose normal.     Mouth/Throat:     Lips: Pink.  Eyes:     General: Lids are normal. Vision grossly intact. Gaze aligned appropriately.     Extraocular Movements: Extraocular movements intact.     Conjunctiva/sclera: Conjunctivae normal.  Pulmonary:     Effort: Pulmonary effort is normal.  Abdominal:     General: Bowel sounds are normal.     Palpations: Abdomen is soft.     Tenderness: There is no abdominal tenderness. There is no right CVA tenderness, left CVA tenderness or guarding.  Musculoskeletal:     Cervical back: Neck supple.  Skin:    General: Skin is warm and dry.     Capillary Refill: Capillary refill takes less than 2 seconds.     Findings: No rash.  Neurological:     General: No focal deficit present.     Mental Status: She is alert and oriented to person, place, and time. Mental status is at baseline.     Cranial Nerves: No dysarthria or facial asymmetry.  Psychiatric:        Mood and Affect: Mood normal.        Speech: Speech normal.        Behavior: Behavior normal.        Thought Content: Thought content normal.        Judgment: Judgment normal.      UC Treatments / Results  Labs (all labs ordered are listed, but only abnormal results are displayed) Labs Reviewed  POCT URINALYSIS DIP (MANUAL ENTRY) - Abnormal; Notable for the following components:      Result Value   Clarity, UA cloudy (*)    Blood, UA large (*)    Protein Ur, POC =30 (*)    Leukocytes, UA Large (3+) (*)    All other components within normal limits  URINE CULTURE    EKG   Radiology No results found.  Procedures Procedures  (including critical care time)  Medications Ordered in UC Medications - No data to display  Initial Impression / Assessment and Plan / UC Course  I have reviewed the triage vital signs and the nursing notes.  Pertinent labs & imaging results that were available during my care of the patient were reviewed by me and considered in my medical decision making (see chart for details).   1.  Acute cystitis with hematuria, type 2 diabetes with other specified complication Presentation is consistent with acute uncomplicated cystitis. Patient is nontoxic in appearance with hemodynamically stable vital signs. Low suspicion for acute pyelonephritis. Low suspicion for kidney stone or infected stone. Urine pregnancy is negative. No indication for labs or imaging at this time. She has a penicillin allergy but states she gets a yeast infection to the mouth when she takes this.  Denies rash and anaphylactic symptoms with penicillin use, therefore comfortable prescribing cephalosporin.  Keflex sent to pharmacy. Denies allergies to antibiotics. Urine culture pending. Patient to push fluids to stay well hydrated and reduce intake of known urinary irritants.  Discussed physical exam and available lab work findings in clinic with patient.  Counseled patient regarding appropriate use of medications and potential side effects for all medications recommended or prescribed today. Discussed red flag signs and symptoms of worsening condition,when to call the PCP office, return to urgent care, and when to seek higher level of care in the emergency department. Patient verbalizes understanding and agreement with plan. All questions answered. Patient discharged in stable condition.  Final Clinical Impressions(s) / UC Diagnoses   Final diagnoses:  Acute cystitis with hematuria  Type 2 diabetes mellitus with other specified complication, without long-term current use of insulin [facility])     Discharge Instructions       Your urine shows you likely have a urinary tract infection. I have sent your urine for culture to confirm this. We will go ahead and have you start taking antibiotics due to your symptoms.  Take antibiotic as directed.  (Keflex 500mg  every 12 hours for 7 days) If you develop diarrhea while taking this medication you may purchase an over-the-counter probiotic or eat yogurt with live active cultures.  To avoid GI upset please take this medication with food. I have sent your urine for culture to see what type of bacteria grows. We will call you if we need to change the treatment plan based on the results of your urine culture.  If you develop any new or worsening symptoms or do not improve in the next 2 to 3 days, please return.  If your symptoms are severe, please go to the emergency room.  Follow-up with your primary care provider for further evaluation and management of your symptoms as well as ongoing wellness visits.  I hope you feel better!     ED Prescriptions     Medication Sig Dispense Auth. Provider   cephALEXin (KEFLEX) 500 MG capsule Take 1 capsule (500 mg total) by mouth 2 (two) times daily for 7 days. 14 capsule Carlisle Beers, FNP      PDMP not reviewed this encounter.   Reita May [place], [place] 01/03/23 [phone]

## 2023-01-03 NOTE — Discharge Instructions (Addendum)
Your urine shows you likely have a urinary tract infection. I have sent your urine for culture to confirm this. We will go ahead and have you start taking antibiotics due to your symptoms.  Take antibiotic as directed.  (Keflex 500mg every 12 hours for 7 days) If you develop diarrhea while taking this medication you may purchase an over-the-counter probiotic or eat yogurt with live active cultures.  To avoid GI upset please take this medication with food. I have sent your urine for culture to see what type of bacteria grows. We will call you if we need to change the treatment plan based on the results of your urine culture.  If you develop any new or worsening symptoms or do not improve in the next 2 to 3 days, please return.  If your symptoms are severe, please go to the emergency room.  Follow-up with your primary care provider for further evaluation and management of your symptoms as well as ongoing wellness visits.  I hope you feel better!  

## 2023-01-03 NOTE — ED Triage Notes (Signed)
C/O dysuria onset during the night last night along with polyuria. Noticed some hematuria today. Denies flank pain or fevers.

## 2023-01-05 LAB — URINE CULTURE: Culture: 80000 — AB

## 2023-02-08 DIAGNOSIS — I10 Essential (primary) hypertension: Secondary | ICD-10-CM | POA: Diagnosis not present

## 2023-02-08 DIAGNOSIS — E7800 Pure hypercholesterolemia, unspecified: Secondary | ICD-10-CM | POA: Diagnosis not present

## 2023-02-08 DIAGNOSIS — E119 Type 2 diabetes mellitus without complications: Secondary | ICD-10-CM | POA: Diagnosis not present

## 2023-02-08 DIAGNOSIS — N760 Acute vaginitis: Secondary | ICD-10-CM | POA: Diagnosis not present

## 2023-03-01 DIAGNOSIS — E876 Hypokalemia: Secondary | ICD-10-CM | POA: Diagnosis not present

## 2023-05-24 ENCOUNTER — Ambulatory Visit (INDEPENDENT_AMBULATORY_CARE_PROVIDER_SITE_OTHER): Payer: BC Managed Care – PPO | Admitting: Podiatry

## 2023-05-24 DIAGNOSIS — E1142 Type 2 diabetes mellitus with diabetic polyneuropathy: Secondary | ICD-10-CM

## 2023-05-25 NOTE — Progress Notes (Signed)
She presents today for follow-up of her diabetic peripheral neuropathy and diabetic check.  She states that all in all she is doing very well she has a good demeanor about her and she seems in good spirits.  Objective: Vital signs are stable she is alert and oriented x 3.  Pulses are palpable.  She does have diminished peripheral sensation per Phoebe Perch monofilament particularly around the distal hallux but reconstitutes around to the lesser digits forefoot midfoot and rear foot..  Muscle strength is full 5/5 dorsiflexors plantar flexors inverters and everters intrinsic musculature is intact.  She does have some osteoarthritic change to the second PIPJ right foot  Assessment: Diabetes mellitus with diabetic peripheral neuropathy osteoarthritis second digit right.  Plan: Continue all conservative therapies follow-up with me with any questions or concerns.  Otherwise I will see her in 6 months

## 2023-08-19 DIAGNOSIS — Z0000 Encounter for general adult medical examination without abnormal findings: Secondary | ICD-10-CM | POA: Diagnosis not present

## 2023-08-19 DIAGNOSIS — E559 Vitamin D deficiency, unspecified: Secondary | ICD-10-CM | POA: Diagnosis not present

## 2023-08-19 DIAGNOSIS — E119 Type 2 diabetes mellitus without complications: Secondary | ICD-10-CM | POA: Diagnosis not present

## 2023-08-19 DIAGNOSIS — E7800 Pure hypercholesterolemia, unspecified: Secondary | ICD-10-CM | POA: Diagnosis not present

## 2023-08-19 DIAGNOSIS — Z23 Encounter for immunization: Secondary | ICD-10-CM | POA: Diagnosis not present

## 2023-08-19 DIAGNOSIS — Z79899 Other long term (current) drug therapy: Secondary | ICD-10-CM | POA: Diagnosis not present

## 2023-09-21 ENCOUNTER — Other Ambulatory Visit: Payer: Self-pay | Admitting: Podiatry

## 2023-10-24 ENCOUNTER — Ambulatory Visit

## 2023-11-22 ENCOUNTER — Ambulatory Visit (INDEPENDENT_AMBULATORY_CARE_PROVIDER_SITE_OTHER): Payer: BC Managed Care – PPO | Admitting: Podiatry

## 2023-11-22 ENCOUNTER — Encounter: Payer: Self-pay | Admitting: Podiatry

## 2023-11-22 DIAGNOSIS — E1142 Type 2 diabetes mellitus with diabetic polyneuropathy: Secondary | ICD-10-CM | POA: Diagnosis not present

## 2023-11-23 NOTE — Progress Notes (Signed)
 She presents today for follow-up of her diabetic peripheral neuropathy and diabetic check.  She states that all in all she is doing very well she has a good demeanor about her and she seems in good spirits.  Objective: Vital signs are stable she is alert and oriented x 3.  Pulses are palpable.  She does have diminished peripheral sensation per Phoebe Perch monofilament particularly around the distal hallux but reconstitutes around to the lesser digits forefoot midfoot and rear foot..  Muscle strength is full 5/5 dorsiflexors plantar flexors inverters and everters intrinsic musculature is intact.  She does have some osteoarthritic change to the second PIPJ right foot  Assessment: Diabetes mellitus with diabetic peripheral neuropathy osteoarthritis second digit right.  Plan: Continue all conservative therapies follow-up with me with any questions or concerns.  Otherwise I will see her in 6 months

## 2024-01-10 DIAGNOSIS — E119 Type 2 diabetes mellitus without complications: Secondary | ICD-10-CM | POA: Diagnosis not present

## 2024-01-28 ENCOUNTER — Other Ambulatory Visit: Payer: Self-pay | Admitting: Podiatry

## 2024-02-16 DIAGNOSIS — I10 Essential (primary) hypertension: Secondary | ICD-10-CM | POA: Diagnosis not present

## 2024-02-16 DIAGNOSIS — E119 Type 2 diabetes mellitus without complications: Secondary | ICD-10-CM | POA: Diagnosis not present

## 2024-02-16 DIAGNOSIS — E7800 Pure hypercholesterolemia, unspecified: Secondary | ICD-10-CM | POA: Diagnosis not present

## 2024-02-16 DIAGNOSIS — Z79899 Other long term (current) drug therapy: Secondary | ICD-10-CM | POA: Diagnosis not present

## 2024-03-13 ENCOUNTER — Ambulatory Visit (INDEPENDENT_AMBULATORY_CARE_PROVIDER_SITE_OTHER)

## 2024-03-13 ENCOUNTER — Encounter: Payer: Self-pay | Admitting: Podiatry

## 2024-03-13 ENCOUNTER — Ambulatory Visit (INDEPENDENT_AMBULATORY_CARE_PROVIDER_SITE_OTHER): Admitting: Podiatry

## 2024-03-13 DIAGNOSIS — K5909 Other constipation: Secondary | ICD-10-CM | POA: Insufficient documentation

## 2024-03-13 DIAGNOSIS — L4050 Arthropathic psoriasis, unspecified: Secondary | ICD-10-CM | POA: Insufficient documentation

## 2024-03-13 DIAGNOSIS — M109 Gout, unspecified: Secondary | ICD-10-CM

## 2024-03-13 DIAGNOSIS — L409 Psoriasis, unspecified: Secondary | ICD-10-CM | POA: Insufficient documentation

## 2024-03-13 DIAGNOSIS — E1140 Type 2 diabetes mellitus with diabetic neuropathy, unspecified: Secondary | ICD-10-CM | POA: Insufficient documentation

## 2024-03-13 MED ORDER — TRIAMCINOLONE ACETONIDE 40 MG/ML IJ SUSP
20.0000 mg | Freq: Once | INTRAMUSCULAR | Status: AC
  Administered 2024-03-13: 20 mg

## 2024-03-13 NOTE — Progress Notes (Unsigned)
 She presents today first metatarsophalangeal joint pain right x 2 days.  Thought initially she had just rubbed it with her shoes that she was breaking into a new pair shoes but then noticed that it was red hot swollen and tender.  She states it has been 2 days ago now.  Has had a history of gout.  Is currently a diabetic and currently taking antibiotics for an abscessed tooth.  She is just finishing those today.  Objective: Vital signs are stable alert and oriented x 3.  She has red hot swollen tender first metatarsal phalangeal joint of the right foot that has been marked for cellulitis and has receded since that time.  She has some tenderness on range of motion which has some restriction.  Radiographs demonstrate an osseously mature individual with a very early Martell sign forming on the medial head of the first metatarsal right foot.  There is some dorsal spurring with limitation most likely hallux limitus present.  Assessment: Gouty arthritis first metatarsophalangeal joint right.  Plan: I injected around the joint today with 10 mg of Kenalog  pentagrams Marcaine  to the point of maximal tenderness.  Tolerated procedure well without complications I will follow-up with her on an as-needed basis.  Stressed the importance of her following up with us  for uric acid level the next time this happens immediately.

## 2024-04-26 ENCOUNTER — Other Ambulatory Visit: Payer: Self-pay | Admitting: Podiatry

## 2024-05-14 ENCOUNTER — Other Ambulatory Visit: Payer: Self-pay | Admitting: Family Medicine

## 2024-05-14 DIAGNOSIS — Z1231 Encounter for screening mammogram for malignant neoplasm of breast: Secondary | ICD-10-CM

## 2024-05-15 ENCOUNTER — Ambulatory Visit

## 2024-05-24 ENCOUNTER — Ambulatory Visit: Admitting: Podiatry

## 2024-06-19 ENCOUNTER — Ambulatory Visit: Admitting: Podiatry

## 2024-06-19 ENCOUNTER — Encounter: Payer: Self-pay | Admitting: Podiatry

## 2024-06-19 DIAGNOSIS — E1142 Type 2 diabetes mellitus with diabetic polyneuropathy: Secondary | ICD-10-CM

## 2024-06-19 DIAGNOSIS — Z9889 Other specified postprocedural states: Secondary | ICD-10-CM

## 2024-06-19 NOTE — Progress Notes (Signed)
 She presents today for diabetic foot exam.  States that her neuropathy is under good control with her Lyrica  as long as she takes her 1 Lyrica  daily she has no problems whatsoever.  She does relate that her cat will sometimes bite her through the bedcovers at nighttime early in the morning on that is while she she has some marks on her feet.  Objective: Vital signs are stable alert oriented x 3 pulses are palpable.  Neurologic sensorium is intact the lateral aspect of the foot however the medial aspect of the foot including the 1st and 2nd toes bilaterally symmetrical Semmes Weinstein sensation is not unelicitable.  No open lesions or wounds.  She has good strong muscle tone 5/5 dorsiflexors plantar flexors inverters and everters.  No open lesions or wounds some excoriations due to the.  They appear to be healing do not appear to be cellulitic.  Assessment: Diabetes mellitus with diabetic peripheral neuropathy.  Plan: Continue the use of the Lyrica  and we discussed diabetic footcare.

## 2024-12-18 ENCOUNTER — Ambulatory Visit: Admitting: Podiatry
